# Patient Record
Sex: Female | Born: 1950 | Race: Black or African American | Hispanic: No | Smoking: Former smoker
Health system: Southern US, Community
[De-identification: ages and names within clinical notes are randomized; demographics above are authoritative.]

## PROBLEM LIST (undated history)

## (undated) DIAGNOSIS — N184 Chronic kidney disease, stage 4 (severe): Secondary | ICD-10-CM

## (undated) DIAGNOSIS — F209 Schizophrenia, unspecified: Secondary | ICD-10-CM

## (undated) DIAGNOSIS — I10 Essential (primary) hypertension: Secondary | ICD-10-CM

## (undated) DIAGNOSIS — E119 Type 2 diabetes mellitus without complications: Secondary | ICD-10-CM

## (undated) DIAGNOSIS — D649 Anemia, unspecified: Secondary | ICD-10-CM

## (undated) DIAGNOSIS — K219 Gastro-esophageal reflux disease without esophagitis: Secondary | ICD-10-CM

## (undated) DIAGNOSIS — R918 Other nonspecific abnormal finding of lung field: Secondary | ICD-10-CM

## (undated) DIAGNOSIS — I639 Cerebral infarction, unspecified: Secondary | ICD-10-CM

## (undated) DIAGNOSIS — F329 Major depressive disorder, single episode, unspecified: Secondary | ICD-10-CM

## (undated) DIAGNOSIS — E7800 Pure hypercholesterolemia, unspecified: Secondary | ICD-10-CM

## (undated) DIAGNOSIS — D472 Monoclonal gammopathy: Secondary | ICD-10-CM

## (undated) DIAGNOSIS — M109 Gout, unspecified: Secondary | ICD-10-CM

## (undated) DIAGNOSIS — N189 Chronic kidney disease, unspecified: Secondary | ICD-10-CM

## (undated) DIAGNOSIS — I2510 Atherosclerotic heart disease of native coronary artery without angina pectoris: Secondary | ICD-10-CM

## (undated) DIAGNOSIS — H269 Unspecified cataract: Secondary | ICD-10-CM

## (undated) DIAGNOSIS — I5189 Other ill-defined heart diseases: Secondary | ICD-10-CM

## (undated) DIAGNOSIS — F32A Depression, unspecified: Secondary | ICD-10-CM

## (undated) DIAGNOSIS — R079 Chest pain, unspecified: Secondary | ICD-10-CM

## (undated) HISTORY — DX: Other nonspecific abnormal finding of lung field: R91.8

## (undated) HISTORY — DX: Atherosclerotic heart disease of native coronary artery without angina pectoris: I25.10

## (undated) HISTORY — PX: TUBAL LIGATION: SHX77

## (undated) HISTORY — DX: Unspecified cataract: H26.9

## (undated) HISTORY — DX: Other ill-defined heart diseases: I51.89

## (undated) HISTORY — DX: Chronic kidney disease, unspecified: N18.9

## (undated) HISTORY — DX: Chronic kidney disease, stage 4 (severe): N18.4

## (undated) HISTORY — DX: Anemia, unspecified: D64.9

## (undated) HISTORY — DX: Monoclonal gammopathy: D47.2

## (undated) HISTORY — DX: Chest pain, unspecified: R07.9

---

## 2006-09-05 ENCOUNTER — Inpatient Hospital Stay: Payer: Self-pay | Admitting: Specialist

## 2006-09-05 ENCOUNTER — Other Ambulatory Visit: Payer: Self-pay

## 2006-09-05 HISTORY — PX: ORIF TIBIA PLATEAU: SHX2132

## 2006-09-09 ENCOUNTER — Other Ambulatory Visit: Payer: Self-pay

## 2008-03-20 ENCOUNTER — Ambulatory Visit: Payer: Self-pay | Admitting: Family Medicine

## 2009-02-06 ENCOUNTER — Inpatient Hospital Stay: Payer: Self-pay | Admitting: Internal Medicine

## 2009-04-17 ENCOUNTER — Ambulatory Visit: Payer: Self-pay | Admitting: Internal Medicine

## 2009-12-04 ENCOUNTER — Ambulatory Visit: Payer: Self-pay | Admitting: Gastroenterology

## 2011-02-12 ENCOUNTER — Ambulatory Visit: Payer: Self-pay | Admitting: Internal Medicine

## 2011-08-07 ENCOUNTER — Ambulatory Visit: Payer: Self-pay | Admitting: Internal Medicine

## 2011-10-12 ENCOUNTER — Ambulatory Visit: Payer: Self-pay | Admitting: Internal Medicine

## 2011-10-16 ENCOUNTER — Ambulatory Visit: Payer: Self-pay | Admitting: Internal Medicine

## 2012-08-11 IMAGING — CR DG HUMERUS 2V *R*
1 series · 2 of 2 positions shown · non-contrast
Comparison: none

REASON FOR EXAM: arm pain
COMMENTS:

PROCEDURE:     DXR - DXR HUMERUS RIGHT  - February 12, 2011 [DATE]
RESULT:     No fracture, dislocation or other acute bony abnormality is
identified. No lytic or blastic lesions are seen. No periosteal reactive
changes are identified.

[Series 1: view not recorded · 0.17mm/px · 2 of 2 slices shown]
[im 1/2]
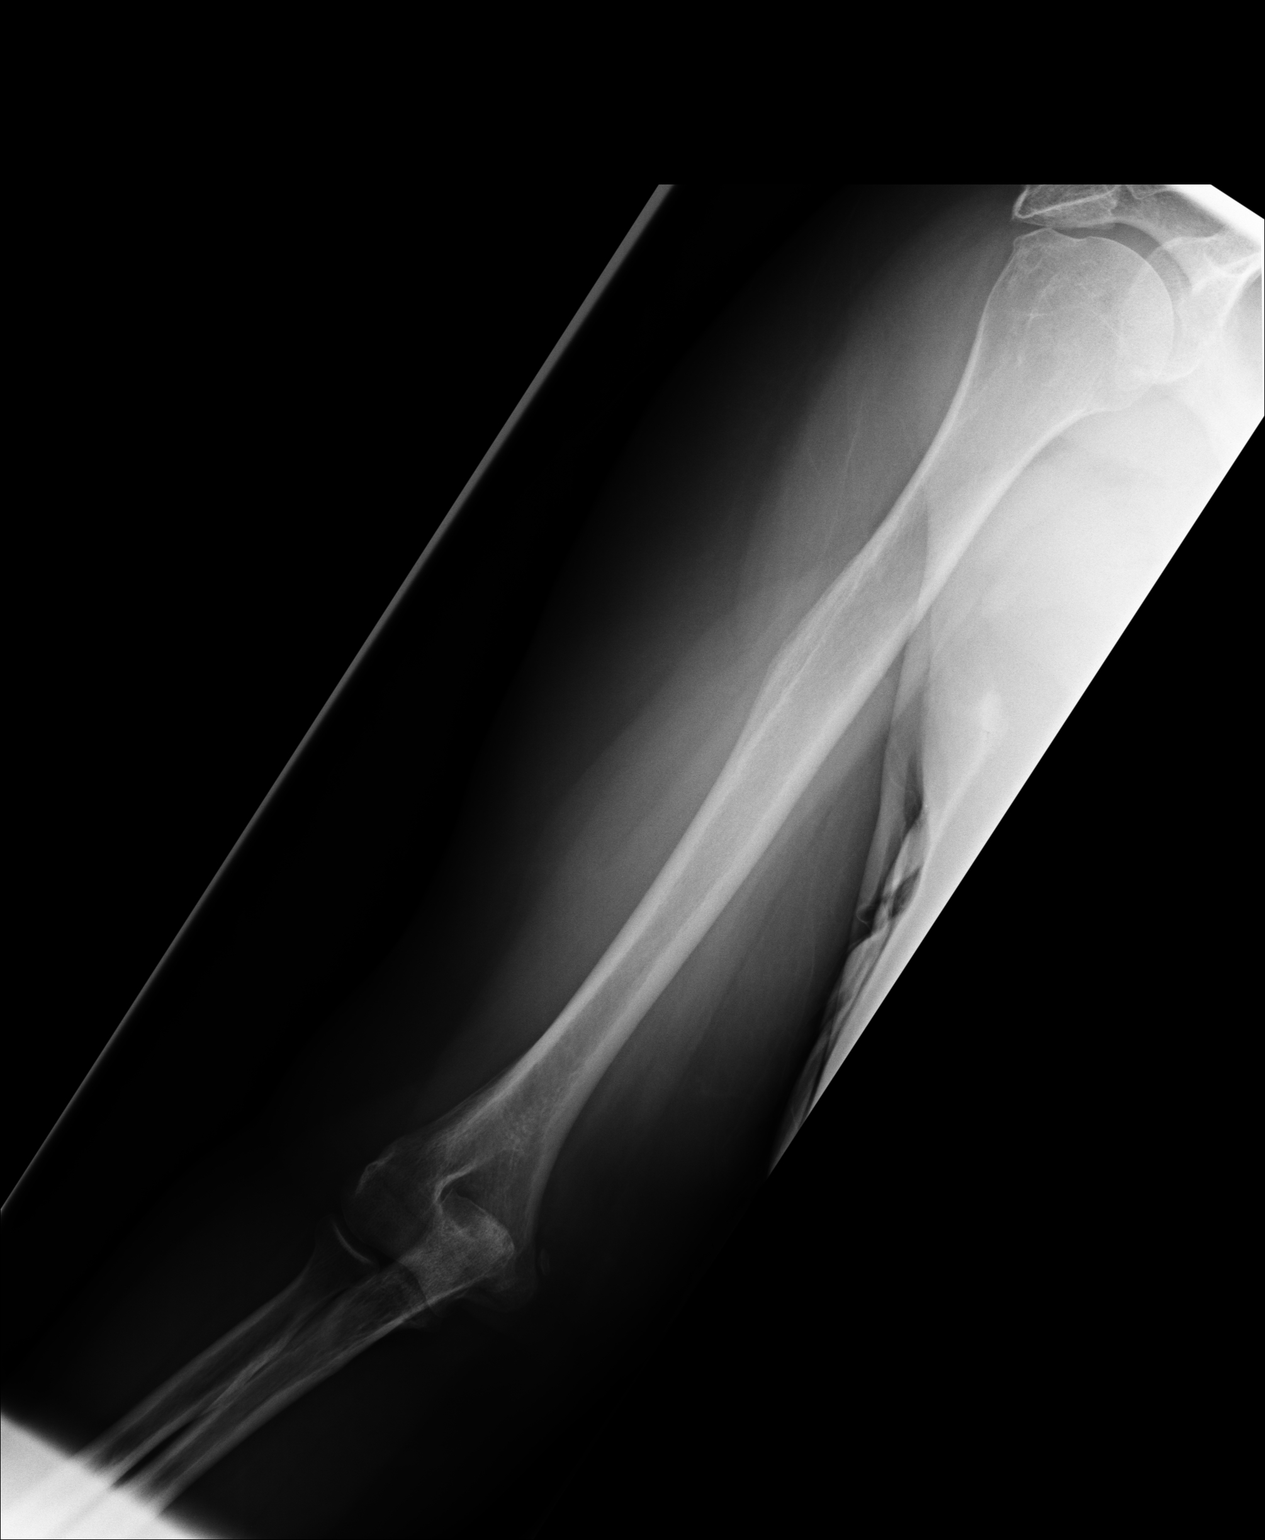
[im 2/2]
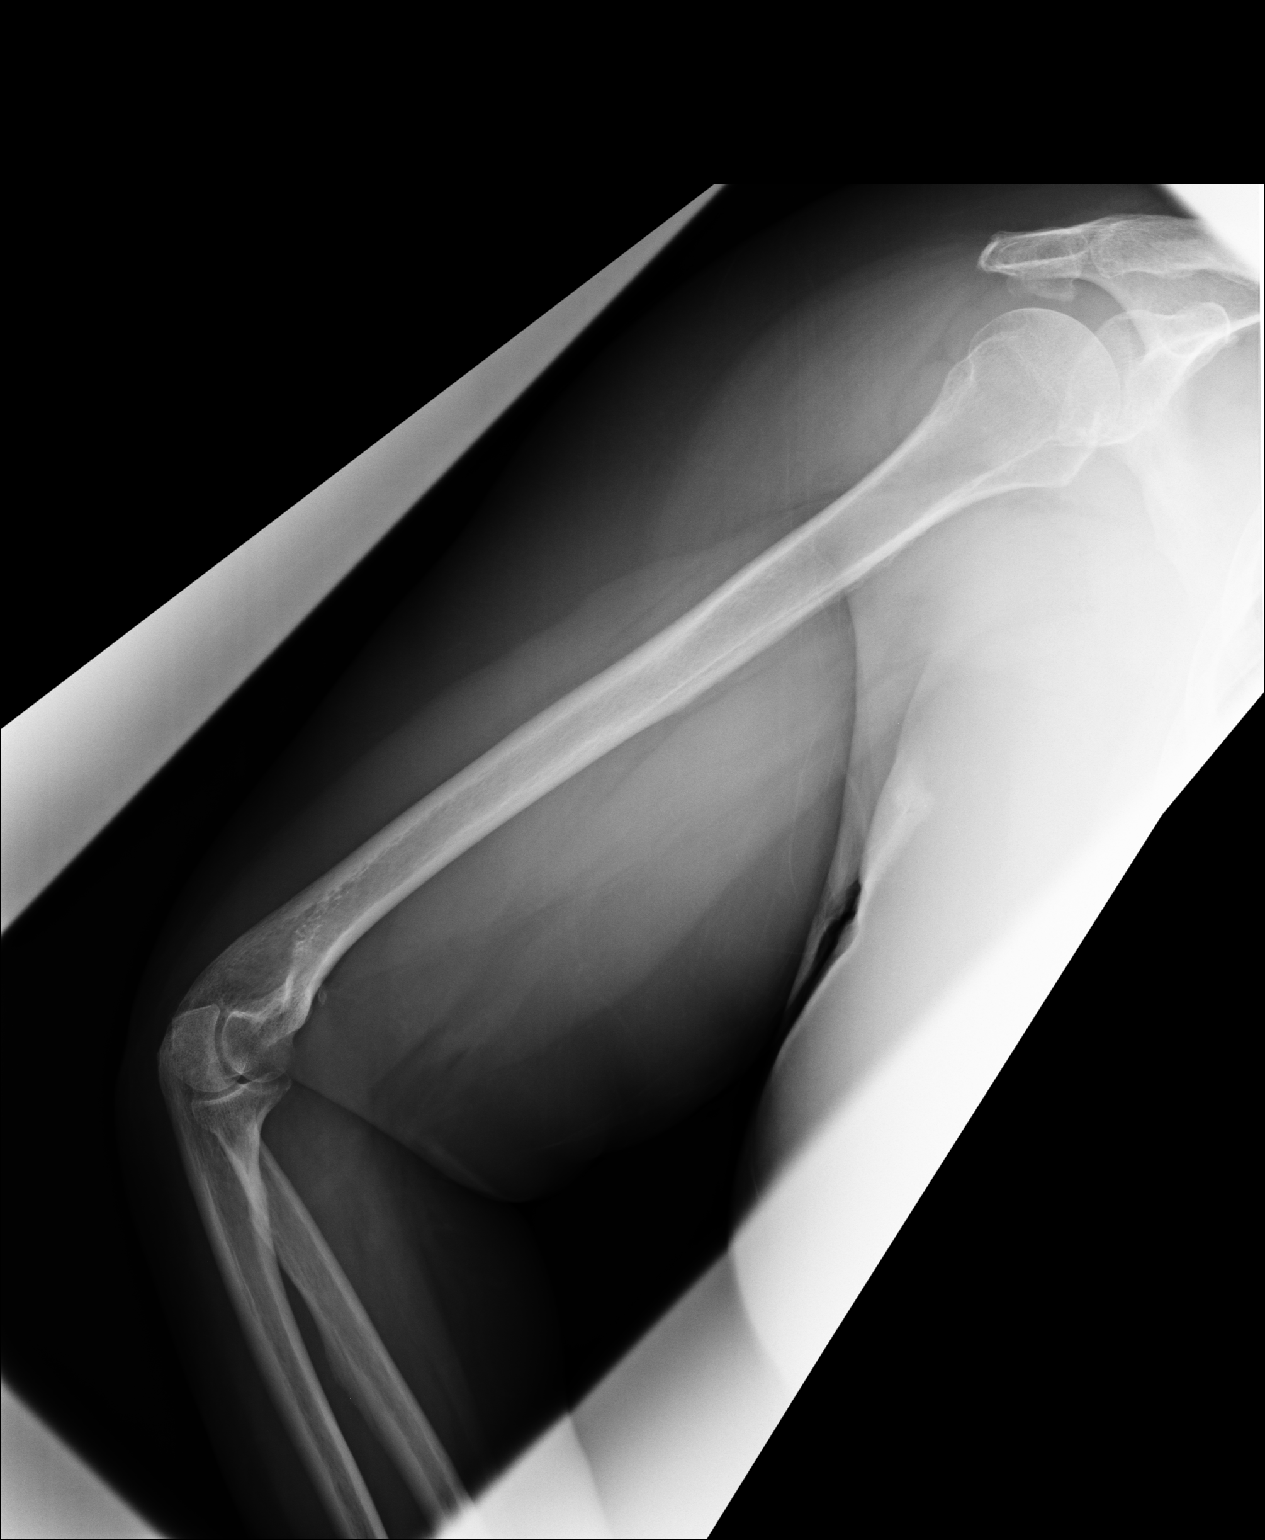

[2 of 2 positions shown; findings below may reference images not displayed]

IMPRESSION: No significant abnormalities are noted.

## 2012-08-18 ENCOUNTER — Emergency Department: Payer: Self-pay | Admitting: Emergency Medicine

## 2012-12-22 ENCOUNTER — Ambulatory Visit: Payer: Self-pay | Admitting: Family Medicine

## 2013-06-19 ENCOUNTER — Emergency Department: Payer: Self-pay | Admitting: Emergency Medicine

## 2013-06-19 LAB — CBC WITH DIFFERENTIAL/PLATELET
Basophil #: 0.1 10*3/uL (ref 0.0–0.1)
Basophil %: 0.6 %
Eosinophil #: 0.1 10*3/uL (ref 0.0–0.7)
Eosinophil %: 1.2 %
HCT: 32.4 % — ABNORMAL LOW (ref 35.0–47.0)
Lymphocyte #: 3.3 10*3/uL (ref 1.0–3.6)
Lymphocyte %: 28.5 %
MCHC: 33.8 g/dL (ref 32.0–36.0)
MCV: 92 fL (ref 80–100)
Monocyte #: 0.7 x10 3/mm (ref 0.2–0.9)
Neutrophil #: 7.4 10*3/uL — ABNORMAL HIGH (ref 1.4–6.5)
Neutrophil %: 63.4 %
Platelet: 250 10*3/uL (ref 150–440)

## 2013-06-19 LAB — COMPREHENSIVE METABOLIC PANEL
Albumin: 3.5 g/dL (ref 3.4–5.0)
Calcium, Total: 9.2 mg/dL (ref 8.5–10.1)
Co2: 28 mmol/L (ref 21–32)
EGFR (African American): 32 — ABNORMAL LOW
EGFR (Non-African Amer.): 27 — ABNORMAL LOW
Osmolality: 290 (ref 275–301)
SGOT(AST): 11 U/L — ABNORMAL LOW (ref 15–37)
SGPT (ALT): 19 U/L (ref 12–78)
Sodium: 139 mmol/L (ref 136–145)
Total Protein: 8.2 g/dL (ref 6.4–8.2)

## 2013-10-22 ENCOUNTER — Emergency Department: Payer: Self-pay | Admitting: Internal Medicine

## 2013-12-19 ENCOUNTER — Ambulatory Visit: Payer: Self-pay | Admitting: Specialist

## 2014-03-26 ENCOUNTER — Ambulatory Visit: Payer: Self-pay | Admitting: Internal Medicine

## 2015-03-28 ENCOUNTER — Ambulatory Visit
Admit: 2015-03-28 | Disposition: A | Discharge status: Home / Self Care | Payer: Self-pay | Attending: Internal Medicine | Admitting: Internal Medicine

## 2016-03-06 NOTE — Discharge Instructions (Signed)

## 2016-03-09 ENCOUNTER — Encounter: Payer: Self-pay | Admitting: *Deleted

## 2016-03-11 ENCOUNTER — Ambulatory Visit
Admission: RE | Admit: 2016-03-11 | Discharge: 2016-03-11 | Disposition: A | Discharge status: Home / Self Care | Payer: Medicare Other | Source: Ambulatory Visit | Attending: Ophthalmology | Admitting: Ophthalmology

## 2016-03-11 ENCOUNTER — Ambulatory Visit: Payer: Medicare Other | Admitting: Anesthesiology

## 2016-03-11 ENCOUNTER — Encounter
Admission: RE | Disposition: A | Discharge status: Home / Self Care | Payer: Self-pay | Source: Ambulatory Visit | Attending: Ophthalmology

## 2016-03-11 DIAGNOSIS — Z720 Tobacco use: Secondary | ICD-10-CM | POA: Diagnosis not present

## 2016-03-11 DIAGNOSIS — Z79899 Other long term (current) drug therapy: Secondary | ICD-10-CM | POA: Diagnosis not present

## 2016-03-11 DIAGNOSIS — Z794 Long term (current) use of insulin: Secondary | ICD-10-CM | POA: Insufficient documentation

## 2016-03-11 DIAGNOSIS — F209 Schizophrenia, unspecified: Secondary | ICD-10-CM | POA: Insufficient documentation

## 2016-03-11 DIAGNOSIS — E119 Type 2 diabetes mellitus without complications: Secondary | ICD-10-CM | POA: Insufficient documentation

## 2016-03-11 DIAGNOSIS — F329 Major depressive disorder, single episode, unspecified: Secondary | ICD-10-CM | POA: Insufficient documentation

## 2016-03-11 DIAGNOSIS — H2512 Age-related nuclear cataract, left eye: Secondary | ICD-10-CM | POA: Insufficient documentation

## 2016-03-11 DIAGNOSIS — I10 Essential (primary) hypertension: Secondary | ICD-10-CM | POA: Diagnosis not present

## 2016-03-11 DIAGNOSIS — M1990 Unspecified osteoarthritis, unspecified site: Secondary | ICD-10-CM | POA: Diagnosis not present

## 2016-03-11 HISTORY — DX: Type 2 diabetes mellitus without complications: E11.9

## 2016-03-11 HISTORY — DX: Major depressive disorder, single episode, unspecified: F32.9

## 2016-03-11 HISTORY — DX: Essential (primary) hypertension: I10

## 2016-03-11 HISTORY — DX: Schizophrenia, unspecified: F20.9

## 2016-03-11 HISTORY — DX: Depression, unspecified: F32.A

## 2016-03-11 HISTORY — PX: CATARACT EXTRACTION W/PHACO: SHX586

## 2016-03-11 HISTORY — DX: Pure hypercholesterolemia, unspecified: E78.00

## 2016-03-11 LAB — GLUCOSE, CAPILLARY
GLUCOSE-CAPILLARY: 215 mg/dL — AB (ref 65–99)
GLUCOSE-CAPILLARY: 206 mg/dL — AB (ref 65–99)

## 2016-03-11 SURGERY — PHACOEMULSIFICATION, CATARACT, WITH IOL INSERTION
Anesthesia: Monitor Anesthesia Care | Site: Eye | Laterality: Left | Wound class: Clean

## 2016-03-11 MED ORDER — TETRACAINE HCL 0.5 % OP SOLN
1.0000 [drp] | OPHTHALMIC | Status: DC | PRN
  Administered 2016-03-11: 1 [drp] via OPHTHALMIC

## 2016-03-11 MED ORDER — MIDAZOLAM HCL 2 MG/2ML IJ SOLN
INTRAMUSCULAR | Status: DC | PRN
  Administered 2016-03-11: 2 mg via INTRAVENOUS

## 2016-03-11 MED ORDER — ARMC OPHTHALMIC DILATING GEL
1.0000 "application " | OPHTHALMIC | Status: DC | PRN
  Administered 2016-03-11 (×2): 1 via OPHTHALMIC

## 2016-03-11 MED ORDER — BRIMONIDINE TARTRATE 0.2 % OP SOLN
OPHTHALMIC | Status: DC | PRN
  Administered 2016-03-11: 1 [drp] via OPHTHALMIC

## 2016-03-11 MED ORDER — ACETAMINOPHEN 160 MG/5ML PO SOLN: 325.0000 mg | ORAL | Status: DC | PRN

## 2016-03-11 MED ORDER — ACETAMINOPHEN 325 MG PO TABS: 325.0000 mg | ORAL_TABLET | ORAL | Status: DC | PRN

## 2016-03-11 MED ORDER — NA HYALUR & NA CHOND-NA HYALUR 0.4-0.35 ML IO KIT
PACK | INTRAOCULAR | Status: DC | PRN
  Administered 2016-03-11: 1 mL via INTRAOCULAR

## 2016-03-11 MED ORDER — POVIDONE-IODINE 5 % OP SOLN
1.0000 "application " | OPHTHALMIC | Status: DC | PRN
  Administered 2016-03-11: 1 via OPHTHALMIC

## 2016-03-11 MED ORDER — FENTANYL CITRATE (PF) 100 MCG/2ML IJ SOLN
INTRAMUSCULAR | Status: DC | PRN
  Administered 2016-03-11: 50 ug via INTRAVENOUS

## 2016-03-11 MED ORDER — BSS IO SOLN
INTRAOCULAR | Status: DC | PRN
  Administered 2016-03-11: 73 mL via OPHTHALMIC

## 2016-03-11 MED ORDER — CEFUROXIME OPHTHALMIC INJECTION 1 MG/0.1 ML
INJECTION | OPHTHALMIC | Status: DC | PRN
  Administered 2016-03-11: 0.1 mL via INTRACAMERAL

## 2016-03-11 MED ORDER — TIMOLOL MALEATE 0.5 % OP SOLN
OPHTHALMIC | Status: DC | PRN
  Administered 2016-03-11: 1 [drp] via OPHTHALMIC

## 2016-03-11 SURGICAL SUPPLY — 26 items
CANNULA ANT/CHMB 27GA (MISCELLANEOUS) ×3 IMPLANT
CARTRIDGE ABBOTT (MISCELLANEOUS) ×3 IMPLANT
GLOVE SURG LX 7.5 STRW (GLOVE) ×2
GLOVE SURG LX STRL 7.5 STRW (GLOVE) ×1 IMPLANT
GLOVE SURG TRIUMPH 8.0 PF LTX (GLOVE) ×3 IMPLANT
GOWN STRL REUS W/ TWL LRG LVL3 (GOWN DISPOSABLE) ×2 IMPLANT
GOWN STRL REUS W/TWL LRG LVL3 (GOWN DISPOSABLE) ×4
LENS IOL TECNIS ITEC 18.0 (Intraocular Lens) ×3 IMPLANT
MARKER SKIN DUAL TIP RULER LAB (MISCELLANEOUS) ×3 IMPLANT
NDL RETROBULBAR .5 NSTRL (NEEDLE) IMPLANT
NEEDLE FILTER BLUNT 18X 1/2SAF (NEEDLE) ×2
NEEDLE FILTER BLUNT 18X1 1/2 (NEEDLE) ×1 IMPLANT
PACK CATARACT BRASINGTON (MISCELLANEOUS) ×3 IMPLANT
PACK EYE AFTER SURG (MISCELLANEOUS) ×3 IMPLANT
PACK OPTHALMIC (MISCELLANEOUS) ×3 IMPLANT
RING MALYGIN 7.0 (MISCELLANEOUS) IMPLANT
SUT ETHILON 10-0 CS-B-6CS-B-6 (SUTURE)
SUT VICRYL  9 0 (SUTURE)
SUT VICRYL 9 0 (SUTURE) IMPLANT
SUTURE EHLN 10-0 CS-B-6CS-B-6 (SUTURE) IMPLANT
SYR 3ML LL SCALE MARK (SYRINGE) ×3 IMPLANT
SYR 5ML LL (SYRINGE) IMPLANT
SYR TB 1ML LUER SLIP (SYRINGE) ×3 IMPLANT
WATER STERILE IRR 250ML POUR (IV SOLUTION) ×3 IMPLANT
WATER STERILE IRR 500ML POUR (IV SOLUTION) IMPLANT
WIPE NON LINTING 3.25X3.25 (MISCELLANEOUS) ×3 IMPLANT

## 2016-03-11 NOTE — Op Note (Signed)
OPERATIVE NOTE  Jennifer Wood [id number] 03/11/2016   PREOPERATIVE DIAGNOSIS:  Nuclear sclerotic cataract left eye. H25.12   POSTOPERATIVE DIAGNOSIS:    Nuclear sclerotic cataract left eye.     PROCEDURE:  Phacoemusification with posterior chamber intraocular lens placement of the left eye   LENS:   Implant Name Type Inv. Item Serial No. Manufacturer Lot No. LRB No. Used  LENS IOL TECNIS 5.0 - [id number] Intraocular Lens LENS IOL TECNIS 5.0 [id number] AMO   Left 1     PCBOO 18.0 D PCIOL   ULTRASOUND TIME: 11  % of 1 minutes 6 seconds, CDE 7.4  SURGEON:  Wyonia Hough, MD   ANESTHESIA:  Topical with tetracaine drops and 2% Xylocaine jelly.   COMPLICATIONS:  None.   DESCRIPTION OF PROCEDURE:  The patient was identified in the holding room and transported to the operating room and placed in the supine position under the operating microscope.  The left eye was identified as the operative eye and it was prepped and draped in the usual sterile ophthalmic fashion.   A 1 millimeter clear-corneal paracentesis was made at the 1:30 position.  The anterior chamber was filled with Viscoat viscoelastic.  A 2.4 millimeter keratome was used to make a near-clear corneal incision at the 10:30 position.  .  A curvilinear capsulorrhexis was made with a cystotome and capsulorrhexis forceps.  Balanced salt solution was used to hydrodissect and hydrodelineate the nucleus.   Phacoemulsification was then used in stop and chop fashion to remove the lens nucleus and epinucleus.  The remaining cortex was then removed using the irrigation and aspiration handpiece. Provisc was then placed into the capsular bag to distend it for lens placement.  A lens was then injected into the capsular bag.  The remaining viscoelastic was aspirated.   Wounds were hydrated with balanced salt solution.  The anterior chamber was inflated to a physiologic pressure with balanced salt solution.  No wound leaks were noted.  Cefuroxime 0.1 ml of a 10mg /ml solution was injected into the anterior chamber for a dose of 1 mg of intracameral antibiotic at the completion of the case.   Timolol and Brimonidine drops were applied to the eye.  The patient was taken to the recovery room in stable condition without complications of anesthesia or surgery.  [name] 03/11/2016, 8:03 AM

## 2016-03-11 NOTE — Anesthesia Postprocedure Evaluation (Signed)
Anesthesia Post Note  Patient: Jennifer Wood  Procedure(s) Performed: Procedure(s) (LRB): CATARACT EXTRACTION PHACO AND INTRAOCULAR LENS PLACEMENT (IOC) (Left)  Patient location during evaluation: PACU Anesthesia Type: MAC Level of consciousness: awake and alert and oriented Pain management: satisfactory to patient Vital Signs Assessment: post-procedure vital signs reviewed and stable Respiratory status: spontaneous breathing, nonlabored ventilation and respiratory function stable Cardiovascular status: blood pressure returned to baseline and stable Postop Assessment: Adequate PO intake and No signs of nausea or vomiting Anesthetic complications: no    Raliegh Ip

## 2016-03-11 NOTE — Transfer of Care (Signed)
Immediate Anesthesia Transfer of Care Note  Patient: Jennifer Wood  Procedure(s) Performed: Procedure(s) with comments: CATARACT EXTRACTION PHACO AND INTRAOCULAR LENS PLACEMENT (IOC) (Left) - DIABETIC - insulin  Patient Location: PACU  Anesthesia Type: MAC  Level of Consciousness: awake, alert  and patient cooperative  Airway and Oxygen Therapy: Patient Spontanous Breathing and Patient connected to supplemental oxygen  Post-op Assessment: Post-op Vital signs reviewed, Patient's Cardiovascular Status Stable, Respiratory Function Stable, Patent Airway and No signs of Nausea or vomiting  Post-op Vital Signs: Reviewed and stable  Complications: No apparent anesthesia complications

## 2016-03-11 NOTE — Anesthesia Preprocedure Evaluation (Signed)
Anesthesia Evaluation  Patient identified by MRN, date of birth, ID band  Reviewed: Allergy & Precautions, H&P , NPO status , Patient's Chart, lab work & pertinent test results  Airway Mallampati: II  TM Distance: >3 FB Neck ROM: full    Dental   Pulmonary    Pulmonary exam normal        Cardiovascular hypertension,  Rhythm:regular Rate:Normal     Neuro/Psych    GI/Hepatic   Endo/Other  diabetes  Renal/GU      Musculoskeletal   Abdominal   Peds  Hematology   Anesthesia Other Findings   Reproductive/Obstetrics                             Anesthesia Physical Anesthesia Plan  ASA: II  Anesthesia Plan: MAC   Post-op Pain Management:    Induction:   Airway Management Planned:   Additional Equipment:   Intra-op Plan:   Post-operative Plan:   Informed Consent: I have reviewed the patients History and Physical, chart, labs and discussed the procedure including the risks, benefits and alternatives for the proposed anesthesia with the patient or authorized representative who has indicated his/her understanding and acceptance.     Plan Discussed with: CRNA  Anesthesia Plan Comments:         Anesthesia Quick Evaluation

## 2016-03-11 NOTE — Anesthesia Procedure Notes (Signed)
Procedure Name: MAC Performed by: Ediberto Sens Pre-anesthesia Checklist: Patient identified, Emergency Drugs available, Suction available, Timeout performed and Patient being monitored Patient Re-evaluated:Patient Re-evaluated prior to inductionOxygen Delivery Method: Nasal cannula Placement Confirmation: positive ETCO2     

## 2016-03-12 ENCOUNTER — Encounter: Payer: Self-pay | Admitting: Ophthalmology

## 2017-03-09 ENCOUNTER — Inpatient Hospital Stay: Payer: Medicare HMO | Attending: Oncology | Admitting: Oncology

## 2017-03-09 ENCOUNTER — Encounter: Payer: Self-pay | Admitting: Oncology

## 2017-03-09 ENCOUNTER — Encounter (INDEPENDENT_AMBULATORY_CARE_PROVIDER_SITE_OTHER): Payer: Self-pay

## 2017-03-09 ENCOUNTER — Inpatient Hospital Stay: Payer: Medicare HMO

## 2017-03-09 VITALS — BP 172/72 | HR 79 | Temp 95.3°F | Resp 18 | Ht 62.99 in | Wt 221.5 lb

## 2017-03-09 DIAGNOSIS — D472 Monoclonal gammopathy: Secondary | ICD-10-CM | POA: Diagnosis not present

## 2017-03-09 DIAGNOSIS — Z794 Long term (current) use of insulin: Secondary | ICD-10-CM | POA: Diagnosis not present

## 2017-03-09 DIAGNOSIS — E1122 Type 2 diabetes mellitus with diabetic chronic kidney disease: Secondary | ICD-10-CM | POA: Diagnosis not present

## 2017-03-09 DIAGNOSIS — E119 Type 2 diabetes mellitus without complications: Secondary | ICD-10-CM | POA: Insufficient documentation

## 2017-03-09 DIAGNOSIS — E7800 Pure hypercholesterolemia, unspecified: Secondary | ICD-10-CM

## 2017-03-09 DIAGNOSIS — R5383 Other fatigue: Secondary | ICD-10-CM

## 2017-03-09 DIAGNOSIS — Z79899 Other long term (current) drug therapy: Secondary | ICD-10-CM

## 2017-03-09 DIAGNOSIS — F329 Major depressive disorder, single episode, unspecified: Secondary | ICD-10-CM | POA: Diagnosis not present

## 2017-03-09 DIAGNOSIS — N189 Chronic kidney disease, unspecified: Secondary | ICD-10-CM

## 2017-03-09 DIAGNOSIS — R531 Weakness: Secondary | ICD-10-CM

## 2017-03-09 DIAGNOSIS — F32A Depression, unspecified: Secondary | ICD-10-CM | POA: Insufficient documentation

## 2017-03-09 DIAGNOSIS — I129 Hypertensive chronic kidney disease with stage 1 through stage 4 chronic kidney disease, or unspecified chronic kidney disease: Secondary | ICD-10-CM | POA: Diagnosis not present

## 2017-03-09 DIAGNOSIS — F209 Schizophrenia, unspecified: Secondary | ICD-10-CM

## 2017-03-09 DIAGNOSIS — I10 Essential (primary) hypertension: Secondary | ICD-10-CM | POA: Insufficient documentation

## 2017-03-09 LAB — LACTATE DEHYDROGENASE: LDH: 135 U/L (ref 98–192)

## 2017-03-09 LAB — CBC WITH DIFFERENTIAL/PLATELET
Basophils Absolute: 0.1 10*3/uL (ref 0–0.1)
Basophils Relative: 1 %
Eosinophils Absolute: 0.1 10*3/uL (ref 0–0.7)
Eosinophils Relative: 1 %
HCT: 29.9 % — ABNORMAL LOW (ref 35.0–47.0)
Hemoglobin: 10.3 g/dL — ABNORMAL LOW (ref 12.0–16.0)
LYMPHS ABS: 2.3 10*3/uL (ref 1.0–3.6)
LYMPHS PCT: 23 %
MCH: 30.4 pg (ref 26.0–34.0)
MCHC: 34.6 g/dL (ref 32.0–36.0)
MCV: 87.9 fL (ref 80.0–100.0)
Monocytes Absolute: 0.8 10*3/uL (ref 0.2–0.9)
Monocytes Relative: 8 %
Neutro Abs: 6.4 10*3/uL (ref 1.4–6.5)
Neutrophils Relative %: 67 %
PLATELETS: 301 10*3/uL (ref 150–440)
RBC: 3.4 MIL/uL — AB (ref 3.80–5.20)
RDW: 12.6 % (ref 11.5–14.5)
WBC: 9.6 10*3/uL (ref 3.6–11.0)

## 2017-03-09 LAB — COMPREHENSIVE METABOLIC PANEL
ALT: 11 U/L — ABNORMAL LOW (ref 14–54)
ANION GAP: 5 (ref 5–15)
AST: 21 U/L (ref 15–41)
Albumin: 3.4 g/dL — ABNORMAL LOW (ref 3.5–5.0)
Alkaline Phosphatase: 69 U/L (ref 38–126)
BILIRUBIN TOTAL: 0.4 mg/dL (ref 0.3–1.2)
BUN: 39 mg/dL — ABNORMAL HIGH (ref 6–20)
CHLORIDE: 103 mmol/L (ref 101–111)
CO2: 26 mmol/L (ref 22–32)
Calcium: 8.8 mg/dL — ABNORMAL LOW (ref 8.9–10.3)
Creatinine, Ser: 1.56 mg/dL — ABNORMAL HIGH (ref 0.44–1.00)
GFR calc Af Amer: 40 mL/min — ABNORMAL LOW (ref >60)
GFR, EST NON AFRICAN AMERICAN: 34 mL/min — AB (ref >60)
Glucose, Bld: 220 mg/dL — ABNORMAL HIGH (ref 65–99)
POTASSIUM: 4.5 mmol/L (ref 3.5–5.1)
Sodium: 134 mmol/L — ABNORMAL LOW (ref 135–145)
Total Protein: 7.7 g/dL (ref 6.5–8.1)

## 2017-03-09 LAB — RETICULOCYTES
RBC.: 3.4 MIL/uL — AB (ref 3.80–5.20)
RBC.: 3.45 MIL/uL — AB (ref 3.80–5.20)
Retic Count, Absolute: 44.2 10*3/uL (ref 19.0–183.0)
Retic Count, Absolute: 48.3 10*3/uL (ref 19.0–183.0)
Retic Ct Pct: 1.3 % (ref 0.4–3.1)
Retic Ct Pct: 1.4 % (ref 0.4–3.1)

## 2017-03-09 LAB — IRON AND TIBC
Iron: 45 ug/dL (ref 28–170)
Saturation Ratios: 15 % (ref 10.4–31.8)
TIBC: 303 ug/dL (ref 250–450)
UIBC: 258 ug/dL

## 2017-03-09 LAB — FERRITIN: Ferritin: 51 ng/mL (ref 11–307)

## 2017-03-09 LAB — SEDIMENTATION RATE: Sed Rate: 70 mm/hr — ABNORMAL HIGH (ref 0–30)

## 2017-03-09 LAB — FOLATE: Folate: 17.5 ng/mL (ref >5.9)

## 2017-03-09 LAB — TSH: TSH: 1.495 u[IU]/mL (ref 0.350–4.500)

## 2017-03-09 LAB — VITAMIN B12: Vitamin B-12: 385 pg/mL (ref 180–914)

## 2017-03-09 NOTE — Progress Notes (Signed)
Referred by Dr Candiss Norse . Pt states she feels well

## 2017-03-09 NOTE — Progress Notes (Signed)
Hematology/Oncology Consult note [facility] Telephone:([phone] Fax:([phone]  Patient Care Team: Ellamae Sia, MD as PCP - General (Internal Medicine)   Name of the patient: Jennifer Wood  [id number]  June 02, 1951    Reason for referral- monclonal protein in serum   Referring physician- Dr. Murlean Iba  Date of visit: 03/09/17   History of presenting illness- Patient is a 66 year old female who sees Dr. Candiss Norse for her CKD. She has long-standing history of hypertension and diabetes as well. Blood work from 11/12/2016 showed hemoglobin of 10.3. BUN and creatinine were elevated at 49/1.54. Patient was noted to have an evidence of 0.9 g as well as 2+ proteinuria. UPEP was negative. CBC from 02/26/2017 showed white count of 9.2, H&H of 9.4/30 and platelet count of 96. Serum calcium was normal at 8.9.CBC from November 2017 showed H&H of 10.3/30.9.  ECOG PS- 1-2  Pain scale- 0   Review of systems- Review of Systems  Constitutional: Positive for malaise/fatigue. Negative for chills, fever and weight loss.  HENT: Negative for congestion, ear discharge and nosebleeds.   Eyes: Negative for blurred vision.  Respiratory: Negative for cough, hemoptysis, sputum production, shortness of breath and wheezing.   Cardiovascular: Negative for chest pain, palpitations, orthopnea and claudication.  Gastrointestinal: Negative for abdominal pain, blood in stool, constipation, diarrhea, heartburn, melena, nausea and vomiting.  Genitourinary: Negative for dysuria, flank pain, frequency, hematuria and urgency.  Musculoskeletal: Positive for back pain. Negative for joint pain and myalgias.  Skin: Negative for rash.  Neurological: Negative for dizziness, tingling, focal weakness, seizures, weakness and headaches.  Endo/Heme/Allergies: Does not bruise/bleed easily.  Psychiatric/Behavioral: Negative for depression and suicidal ideas. The patient does not have insomnia.      No Known Allergies  Patient Active Problem List   Diagnosis Date Noted  . Schizophrenia ([place]) 03/09/2017  . Hypertension 03/09/2017  . Diabetes ([place]) 03/09/2017  . Depression 03/09/2017     Past Medical History:  Diagnosis Date  . Arthritis   . Depression   . Diabetes mellitus without complication ([place])   . Hypercholesteremia   . Hypertension   . Schizophrenia [facility])      Past Surgical History:  Procedure Laterality Date  . CATARACT EXTRACTION W/PHACO Left 03/11/2016   Procedure: CATARACT EXTRACTION PHACO AND INTRAOCULAR LENS PLACEMENT (IOC);  Surgeon: Leandrew Koyanagi, MD;  Location: [place];  Service: Ophthalmology;  Laterality: Left;  DIABETIC - insulin  . ORIF TIBIA PLATEAU Right 09/05/06   [facility]    Social History   Social History  . Marital status: Divorced    Spouse name: N/A  . Number of children: N/A  . Years of education: N/A   Occupational History  . Not on file.   Social History Main Topics  . Smoking status: Never Smoker  . Smokeless tobacco: Not on file  . Alcohol use No  . Drug use: Unknown  . Sexual activity: Not on file   Other Topics Concern  . Not on file   Social History Narrative  . No narrative on file     No family history on file.   Current Outpatient Prescriptions:  .  amLODipine (NORVASC) 5 MG tablet, Take 5 mg by mouth daily., Disp: , Rfl:  .  Cholecalciferol (VITAMIN D PO), Take by mouth., Disp: , Rfl:  .  insulin aspart (NOVOLOG) 100 UNIT/ML injection, Inject into the skin 3 (three) times daily before meals., Disp: , Rfl:  .  insulin glargine (LANTUS) 100 UNIT/ML  injection, Inject 14 Units into the skin at bedtime., Disp: , Rfl:  .  lisinopril (PRINIVIL,ZESTRIL) 20 MG tablet, Take 20 mg by mouth 2 (two) times daily., Disp: , Rfl:  .  lovastatin (MEVACOR) 40 MG tablet, Take 40 mg by mouth 2 (two) times daily., Disp: , Rfl:  .  MELATONIN PO, Take by mouth., Disp: , Rfl:  .  METOPROLOL TARTRATE PO, Take by  mouth daily., Disp: , Rfl:    Physical exam:  Vitals:   03/09/17 1544  BP: (!) 172/72  Pulse: 79  Resp: 18  Temp: (!) 95.3 F (35.2 C)  TempSrc: Tympanic  Weight: 221 lb 7.2 oz (100.4 kg)  Height: 5' 2.99" (1.6 m)   Physical Exam  Constitutional: She is oriented to person, place, and time and well-developed, well-nourished, and in no distress.  HENT:  Head: Normocephalic and atraumatic.  Eyes: EOM are normal. Pupils are equal, round, and reactive to light.  Neck: Normal range of motion.  Cardiovascular: Normal rate, regular rhythm and normal heart sounds.   Pulmonary/Chest: Effort normal and breath sounds normal.  Abdominal: Soft. Bowel sounds are normal.  Neurological: She is alert and oriented to person, place, and time.  Skin: Skin is warm and dry.       CMP Latest Ref Rng & Units 06/19/2013  Glucose 65 - 99 mg/dL 174(H)  BUN 7 - 18 mg/dL 35(H)  Creatinine 0.60 - 1.30 mg/dL 1.94(H)  Sodium 136 - 145 mmol/L 139  Potassium 3.5 - 5.1 mmol/L 4.5  Chloride 98 - 107 mmol/L 103  CO2 21 - 32 mmol/L 28  Calcium 8.5 - 10.1 mg/dL 9.2  Total Protein 6.4 - 8.2 g/dL 8.2  Total Bilirubin 0.2 - 1.0 mg/dL 0.4  Alkaline Phos 50 - 136 Unit/L 84  AST 15 - 37 Unit/L 11(L)  ALT 12 - 78 U/L 19   CBC Latest Ref Rng & Units 06/19/2013  WBC 3.6 - 11.0 x10 3/mm 3 11.7(H)  Hemoglobin 12.0 - 16.0 g/dL 10.9(L)  Hematocrit 35.0 - 47.0 % 32.4(L)  Platelets 150 - 440 x10 3/mm 3 250    No images are attached to the encounter.  No results found.  Assessment and plan- Patient is a 66 y.o. female who has been referred to [place] for monoclonal protein found in serum  Today I will do a complete workup including CBC with differential, CMP, ferritin and iron studies, B12, folate, TSH, haptoglobin, reticulocyte count, ESR and myeloma panel. I will also obtain beta-2 microglobulin, LDH, 24-hour urine protein along with the UPEP and urine immunofixation. Depending on the type and level of monoclonal  protein, I will decide if patient needs further investigation such as skeletal survey and bone marrow biopsy. I will see the patient back in 3 weeks' time to discuss the results of her blood work and further workup   Thank you for this kind referral and the opportunity to participate in the care of this patient   Visit Diagnosis 1. MGUS (monoclonal gammopathy of unknown significance)     Dr. Randa Evens, MD, MPH [facility] at [facility] Pager- [phone] 03/09/2017  5:00 PM

## 2017-03-10 DIAGNOSIS — D472 Monoclonal gammopathy: Secondary | ICD-10-CM | POA: Diagnosis not present

## 2017-03-10 LAB — BETA 2 MICROGLOBULIN, SERUM: BETA 2 MICROGLOBULIN: 4.2 mg/L — AB (ref 0.6–2.4)

## 2017-03-10 LAB — HAPTOGLOBIN: HAPTOGLOBIN: 187 mg/dL (ref 34–200)

## 2017-03-11 LAB — MULTIPLE MYELOMA PANEL, SERUM
ALBUMIN SERPL ELPH-MCNC: 3 g/dL (ref 2.9–4.4)
ALPHA 1: 0.2 g/dL (ref 0.0–0.4)
Albumin/Glob SerPl: 0.8 (ref 0.7–1.7)
Alpha2 Glob SerPl Elph-Mcnc: 0.8 g/dL (ref 0.4–1.0)
B-Globulin SerPl Elph-Mcnc: 1.1 g/dL (ref 0.7–1.3)
Gamma Glob SerPl Elph-Mcnc: 1.9 g/dL — ABNORMAL HIGH (ref 0.4–1.8)
Globulin, Total: 4 g/dL — ABNORMAL HIGH (ref 2.2–3.9)
IGA: 221 mg/dL (ref 87–352)
IGM, SERUM: 134 mg/dL (ref 26–217)
IgG (Immunoglobin G), Serum: 1852 mg/dL — ABNORMAL HIGH (ref 700–1600)
M Protein SerPl Elph-Mcnc: 0.7 g/dL — ABNORMAL HIGH
TOTAL PROTEIN ELP: 7 g/dL (ref 6.0–8.5)

## 2017-03-12 ENCOUNTER — Other Ambulatory Visit: Payer: Self-pay

## 2017-03-12 DIAGNOSIS — D472 Monoclonal gammopathy: Secondary | ICD-10-CM

## 2017-03-15 LAB — IFE+PROTEIN ELECTRO, 24-HR UR
% BETA, Urine: 8.9 %
ALPHA 1 URINE: 0.5 %
Albumin, U: 73.4 %
Alpha 2, Urine: 3.9 %
GAMMA GLOBULIN URINE: 13.2 %
M-SPIKE %, Urine: 3.9 % — ABNORMAL HIGH
M-SPIKE, MG/24 HR: 39 mg/(24.h) — AB
TOTAL PROTEIN, URINE-UPE24: 100.2 mg/dL
TOTAL VOLUME: 1000
Total Protein, Urine-Ur/day: 1002 mg/24 hr — ABNORMAL HIGH (ref 30–150)

## 2017-04-06 ENCOUNTER — Inpatient Hospital Stay: Payer: Medicare HMO | Attending: Oncology | Admitting: Oncology

## 2017-04-06 VITALS — BP 135/66 | HR 64 | Temp 97.7°F | Resp 18 | Wt 214.0 lb

## 2017-04-06 DIAGNOSIS — G8929 Other chronic pain: Secondary | ICD-10-CM | POA: Insufficient documentation

## 2017-04-06 DIAGNOSIS — M549 Dorsalgia, unspecified: Secondary | ICD-10-CM | POA: Diagnosis not present

## 2017-04-06 DIAGNOSIS — F329 Major depressive disorder, single episode, unspecified: Secondary | ICD-10-CM | POA: Diagnosis not present

## 2017-04-06 DIAGNOSIS — E7800 Pure hypercholesterolemia, unspecified: Secondary | ICD-10-CM | POA: Insufficient documentation

## 2017-04-06 DIAGNOSIS — Z794 Long term (current) use of insulin: Secondary | ICD-10-CM | POA: Diagnosis not present

## 2017-04-06 DIAGNOSIS — E119 Type 2 diabetes mellitus without complications: Secondary | ICD-10-CM | POA: Diagnosis not present

## 2017-04-06 DIAGNOSIS — N189 Chronic kidney disease, unspecified: Secondary | ICD-10-CM | POA: Insufficient documentation

## 2017-04-06 DIAGNOSIS — Z79899 Other long term (current) drug therapy: Secondary | ICD-10-CM | POA: Diagnosis not present

## 2017-04-06 DIAGNOSIS — I129 Hypertensive chronic kidney disease with stage 1 through stage 4 chronic kidney disease, or unspecified chronic kidney disease: Secondary | ICD-10-CM | POA: Diagnosis not present

## 2017-04-06 DIAGNOSIS — M129 Arthropathy, unspecified: Secondary | ICD-10-CM | POA: Insufficient documentation

## 2017-04-06 DIAGNOSIS — F17210 Nicotine dependence, cigarettes, uncomplicated: Secondary | ICD-10-CM | POA: Insufficient documentation

## 2017-04-06 DIAGNOSIS — D472 Monoclonal gammopathy: Secondary | ICD-10-CM | POA: Insufficient documentation

## 2017-04-06 NOTE — Progress Notes (Signed)
Hematology/Oncology Consult note [facility]  Telephone:([phone] [phone]  Patient Care Team: Ellamae Sia, MD as PCP - General (Internal Medicine)   Name of the patient: Jennifer Wood  [id number]  09-08-1951   Date of visit: 04/06/17  Diagnosis- MGUS  Chief complaint/ Reason for visit- discuss results of bloodwork  Heme/Onc history: Patient is a 66 year old female who sees Dr. Candiss Norse for her CKD. She has long-standing history of hypertension and diabetes as well. Blood work from 11/12/2016 showed hemoglobin of 10.3. BUN and creatinine were elevated at 49/1.54. Patient was noted to have an evidence of 0.9 g as well as 2+ proteinuria. UPEP was negative. CBC from 02/26/2017 showed white count of 9.2, H&H of 9.4/30 and platelet count of 96. Serum calcium was normal at 8.9.CBC from November 2017 showed H&H of 10.3/30.9.  Results of bloodwork from 03/09/2017 were as follows: CBC showed white count of 9.6, H&H of 10.3/29.9 and a platelet count of 301. CMP was significant for BUN of 39 and creatinine of 1.56. Reticulocyte count was 1.4% and low for the degree of anemia. Ferritin was normal at 51 and iron studies are within normal limits. B12 and folate were within normal limits. TSH was normal at 1.49. Haptoglobin was normal at 187. ESR was elevated at 70. Multiple myeloma panel showed elevated IgG of 1000 852. Serum monoclonal protein of 0.7 g was noted. Lambda light chain specificity. LDH was normal at 135 and beta-2 microglobulin was elevated at 4.2. 24-hour urine protein revealed 1gm proteinuria and 39 mg monoclonal protein in 24 hours.   Interval history- reports chronic back pain. She has been trying to regulate her diet to control her sugars better  ECOG PS- 1 Pain scale- 5   Review of systems- Review of Systems  Constitutional: Negative for chills, fever, malaise/fatigue and weight loss.  HENT: Negative for congestion, ear discharge and  nosebleeds.   Eyes: Negative for blurred vision.  Respiratory: Negative for cough, hemoptysis, sputum production, shortness of breath and wheezing.   Cardiovascular: Negative for chest pain, palpitations, orthopnea and claudication.  Gastrointestinal: Negative for abdominal pain, blood in stool, constipation, diarrhea, heartburn, melena, nausea and vomiting.  Genitourinary: Negative for dysuria, flank pain, frequency, hematuria and urgency.  Musculoskeletal: Negative for back pain, joint pain and myalgias.  Skin: Negative for rash.  Neurological: Negative for dizziness, tingling, focal weakness, seizures, weakness and headaches.  Endo/Heme/Allergies: Does not bruise/bleed easily.  Psychiatric/Behavioral: Negative for depression and suicidal ideas. The patient does not have insomnia.      Current treatment- observation  No Known Allergies   Past Medical History:  Diagnosis Date  . Arthritis   . Depression   . Diabetes mellitus without complication ([place])   . Hypercholesteremia   . Hypertension   . Schizophrenia ([place])    pt denies this dx     Past Surgical History:  Procedure Laterality Date  . CATARACT EXTRACTION W/PHACO Left 03/11/2016   Procedure: CATARACT EXTRACTION PHACO AND INTRAOCULAR LENS PLACEMENT (IOC);  Surgeon: Leandrew Koyanagi, MD;  Location: [place];  Service: Ophthalmology;  Laterality: Left;  DIABETIC - insulin  . ORIF TIBIA PLATEAU Right 09/05/06   [facility]    Social History   Social History  . Marital status: Divorced    Spouse name: N/A  . Number of children: N/A  . Years of education: N/A   Occupational History  . Not on file.   Social History Main Topics  . Smoking status:  Light Tobacco Smoker    Packs/day: 0.25    Years: 5.00  . Smokeless tobacco: Current User  . Alcohol use No  . Drug use: No  . Sexual activity: No   Other Topics Concern  . Not on file   Social History Narrative  . No narrative on file    No family history  on file.   Current Outpatient Prescriptions:  .  amLODipine (NORVASC) 5 MG tablet, Take 5 mg by mouth daily., Disp: , Rfl:  .  B-D UF III MINI PEN NEEDLES 31G X 5 MM MISC, , Disp: , Rfl:  .  Cholecalciferol (VITAMIN D PO), Take by mouth., Disp: , Rfl:  .  insulin aspart (NOVOLOG) 100 UNIT/ML injection, Inject into the skin 3 (three) times daily before meals., Disp: , Rfl:  .  insulin glargine (LANTUS) 100 UNIT/ML injection, Inject 14 Units into the skin at bedtime., Disp: , Rfl:  .  LANTUS SOLOSTAR 100 UNIT/ML Solostar Pen, , Disp: , Rfl:  .  lisinopril (PRINIVIL,ZESTRIL) 20 MG tablet, Take 20 mg by mouth 2 (two) times daily., Disp: , Rfl:  .  lovastatin (MEVACOR) 40 MG tablet, Take 40 mg by mouth 2 (two) times daily., Disp: , Rfl:  .  METOPROLOL TARTRATE PO, Take 100 mg by mouth daily. , Disp: , Rfl:  .  NOVOLOG FLEXPEN 100 UNIT/ML FlexPen, , Disp: , Rfl:  .  TRUE METRIX BLOOD GLUCOSE TEST test strip, , Disp: , Rfl:  .  TRUEPLUS LANCETS 33G MISC, , Disp: , Rfl:   Physical exam:  Vitals:   04/06/17 1106  BP: 135/66  Pulse: 64  Resp: 18  Temp: 97.7 F (36.5 C)  TempSrc: Tympanic  Weight: 214 lb (97.1 kg)   Physical Exam  Constitutional: She is oriented to person, place, and time and well-developed, well-nourished, and in no distress.  HENT:  Head: Normocephalic and atraumatic.  Eyes: EOM are normal. Pupils are equal, round, and reactive to light.  Neck: Normal range of motion.  Cardiovascular: Normal rate, regular rhythm and normal heart sounds.   Pulmonary/Chest: Effort normal and breath sounds normal.  Abdominal: Soft. Bowel sounds are normal.  Neurological: She is alert and oriented to person, place, and time.  Skin: Skin is warm and dry.     CMP Latest Ref Rng & Units 03/09/2017  Glucose 65 - 99 mg/dL 220(H)  BUN 6 - 20 mg/dL 39(H)  Creatinine 0.44 - 1.00 mg/dL 1.56(H)  Sodium 135 - 145 mmol/L 134(L)  Potassium 3.5 - 5.1 mmol/L 4.5  Chloride 101 - 111 mmol/L 103  CO2  22 - 32 mmol/L 26  Calcium 8.9 - 10.3 mg/dL 8.8(L)  Total Protein 6.5 - 8.1 g/dL 7.7  Total Bilirubin 0.3 - 1.2 mg/dL 0.4  Alkaline Phos 38 - 126 U/L 69  AST 15 - 41 U/L 21  ALT 14 - 54 U/L 11(L)   CBC Latest Ref Rng & Units 03/09/2017  WBC 3.6 - 11.0 K/uL 9.6  Hemoglobin 12.0 - 16.0 g/dL 10.3(L)  Hematocrit 35.0 - 47.0 % 29.9(L)  Platelets 150 - 440 K/uL 301      Assessment and plan- Patient is a 66 y.o. female who was referred to [place] for abnormal SPEP  I discussed the results of the blood work with the patient which shows that she has a small amount of monoclonal 0.7 g in her serum which is likely consistent with MGUS. She does have significant proteinuria of 1 g along with IgG  monoclonal protein in her urine as well. I suspect that a significant degree of her proteinuria is because of her diabetes which remains uncontrolled and her chronic kidney disease is also likely secondary to diabetes. Given that she has significant proteinuria I would like to complete her multiple myeloma workup and proceed with a skeletal survey and bone marrow biopsy. If these findings are consistent with MGUS, I am inclined to monitor this conservatively with periodic blood work.   I will see the patient back in 4 weeks time after skeletal survey and bone marrow biopsy   Visit Diagnosis 1. MGUS (monoclonal gammopathy of unknown significance)      Dr. Randa Evens, MD, MPH [facility] at [facility] Pager- [phone] 04/06/2017 1:33 PM

## 2017-04-07 ENCOUNTER — Ambulatory Visit
Admission: RE | Admit: 2017-04-07 | Discharge: 2017-04-07 | Disposition: A | Discharge status: Home / Self Care | Payer: Medicare HMO | Source: Ambulatory Visit | Attending: Oncology | Admitting: Oncology

## 2017-04-07 ENCOUNTER — Other Ambulatory Visit: Payer: Self-pay | Admitting: Oncology

## 2017-04-07 DIAGNOSIS — D472 Monoclonal gammopathy: Secondary | ICD-10-CM | POA: Diagnosis present

## 2017-04-13 ENCOUNTER — Other Ambulatory Visit: Payer: Self-pay | Admitting: Oncology

## 2017-04-13 ENCOUNTER — Telehealth: Payer: Self-pay | Admitting: *Deleted

## 2017-04-13 DIAGNOSIS — D472 Monoclonal gammopathy: Secondary | ICD-10-CM

## 2017-04-13 NOTE — Telephone Encounter (Signed)
Notified patient of her Bone Marrow biopsy appt. On 4/24. Patient was given the following instructions: Nothing by mouth after midnight the day before the procedure. Take medications with small sip of water. [facility] medical mall at 8:00 AM Must have driver for trip home. Her next appointment is 04/27/2017 at 11:45 AM at the cancer center with Dr. Janese Banks. Patient was able to repeat back instructions and verbalized understanding.

## 2017-04-19 ENCOUNTER — Other Ambulatory Visit: Payer: Self-pay | Admitting: Radiology

## 2017-04-20 ENCOUNTER — Ambulatory Visit
Admission: RE | Admit: 2017-04-20 | Discharge: 2017-04-20 | Disposition: A | Discharge status: Home / Self Care | Payer: Medicare HMO | Source: Ambulatory Visit | Attending: Oncology | Admitting: Oncology

## 2017-04-20 ENCOUNTER — Other Ambulatory Visit (HOSPITAL_COMMUNITY)
Admission: RE | Admit: 2017-04-20 | Disposition: A | Discharge status: Home / Self Care | Payer: Medicare HMO | Source: Ambulatory Visit | Attending: Oncology | Admitting: Oncology

## 2017-04-20 ENCOUNTER — Ambulatory Visit: Payer: Medicare HMO

## 2017-04-20 DIAGNOSIS — D72822 Plasmacytosis: Secondary | ICD-10-CM | POA: Insufficient documentation

## 2017-04-20 DIAGNOSIS — D649 Anemia, unspecified: Secondary | ICD-10-CM | POA: Diagnosis not present

## 2017-04-20 DIAGNOSIS — D472 Monoclonal gammopathy: Secondary | ICD-10-CM | POA: Diagnosis not present

## 2017-04-20 LAB — PROTIME-INR
INR: 1.06
Prothrombin Time: 13.8 seconds (ref 11.4–15.2)

## 2017-04-20 LAB — CBC WITH DIFFERENTIAL/PLATELET
BASOS ABS: 0.1 10*3/uL (ref 0–0.1)
Basophils Relative: 1 %
EOS ABS: 0.1 10*3/uL (ref 0–0.7)
Eosinophils Relative: 1 %
HCT: 30.4 % — ABNORMAL LOW (ref 35.0–47.0)
Hemoglobin: 10.3 g/dL — ABNORMAL LOW (ref 12.0–16.0)
Lymphocytes Relative: 23 %
Lymphs Abs: 1.8 10*3/uL (ref 1.0–3.6)
MCH: 30.1 pg (ref 26.0–34.0)
MCHC: 33.8 g/dL (ref 32.0–36.0)
MCV: 89.2 fL (ref 80.0–100.0)
MONOS PCT: 8 %
Monocytes Absolute: 0.6 10*3/uL (ref 0.2–0.9)
Neutro Abs: 5.3 10*3/uL (ref 1.4–6.5)
Neutrophils Relative %: 67 %
Platelets: 311 10*3/uL (ref 150–440)
RBC: 3.41 MIL/uL — ABNORMAL LOW (ref 3.80–5.20)
RDW: 13.4 % (ref 11.5–14.5)
WBC: 7.8 10*3/uL (ref 3.6–11.0)

## 2017-04-20 LAB — GLUCOSE, CAPILLARY: GLUCOSE-CAPILLARY: 168 mg/dL — AB (ref 65–99)

## 2017-04-20 LAB — APTT: aPTT: 44 seconds — ABNORMAL HIGH (ref 24–36)

## 2017-04-20 MED ORDER — SODIUM CHLORIDE 0.9 % IV SOLN
INTRAVENOUS | Status: DC
  Administered 2017-04-20: 09:00:00 via INTRAVENOUS

## 2017-04-20 MED ORDER — MIDAZOLAM HCL 2 MG/2ML IJ SOLN
INTRAMUSCULAR | Status: AC | PRN
  Administered 2017-04-20: 1 mg via INTRAVENOUS

## 2017-04-20 MED ORDER — FENTANYL CITRATE (PF) 100 MCG/2ML IJ SOLN
INTRAMUSCULAR | Status: AC | PRN
  Administered 2017-04-20: 25 ug via INTRAVENOUS
  Administered 2017-04-20: 50 ug via INTRAVENOUS
  Administered 2017-04-20: 25 ug via INTRAVENOUS

## 2017-04-20 MED ORDER — HYDROCODONE-ACETAMINOPHEN 5-325 MG PO TABS: 1.0000 | ORAL_TABLET | ORAL | Status: DC | PRN

## 2017-04-20 MED ORDER — MIDAZOLAM HCL 5 MG/5ML IJ SOLN
INTRAMUSCULAR | Status: AC | PRN
  Administered 2017-04-20 (×2): 1 mg via INTRAVENOUS

## 2017-04-20 NOTE — Discharge Instructions (Signed)
Needle Biopsy of the Bone °A bone biopsy is a procedure in which a small sample of bone is removed. The sample is taken with a needle. Then, the bone sample is looked at under a microscope to check for abnormalities. The sample is usually taken from a bone that is close to the skin. This procedure may be done to check for various problems with the bone. You may need this procedure if imaging tests or blood tests have indicated a possible problem. This procedure may be done to help determine if a bone tumor is cancerous (malignant). A bone biopsy can help to diagnose problems such as: °· Tumors of the bone (sarcomas) and bone marrow (multiple myeloma). °· Bone that forms abnormally (Paget disease). °· Noncancerous (benign) bone cysts. °· Bony growths. °· Infections in the bone. °Tell a health care provider about: °· Any allergies you have. °· All medicines you are taking, including vitamins, herbs, eye drops, creams, and over-the-counter medicines. °· Any problems you or family members have had with anesthetic medicines. °· Any blood disorders you have. °· Any surgeries you have had. °· Any medical conditions you have. °What are the risks? °Generally, this is a safe procedure. However, problems may occur, including: °· Excessive bleeding. °· Infection. °· Injury to surrounding tissue. °What happens before the procedure? °· Ask your health care provider about: °¨ Changing or stopping your regular medicines. This is especially important if you are taking diabetes medicines or blood thinners. °¨ Taking medicines such as aspirin and ibuprofen. These medicines can thin your blood. Do not take these medicines before your procedure if your health care provider instructs you not to. °· Follow instructions from your health care provider about eating or drinking restrictions. °· Plan to have someone take you home after the procedure. °· If you go home right after the procedure, plan to have someone with you for 24 hours. °What  happens during the procedure? °· An IV tube may be inserted into one of your veins. °· The injection site will be cleaned with a germ-killing solution (antiseptic). °· You will be given one or more of the following: °¨ A medicine to help you relax (sedative). °¨ A medicine to numb the area (local anesthetic). °· The sample of bone will be removed by putting a large needle through the skin and into the bone. °· The needle will be removed. °· A bandage (dressing) will be placed over the insertion site and taped in place. °The procedure may vary among health care providers and hospitals. °What happens after the procedure? °· Your blood pressure, heart rate, breathing rate, and blood oxygen level will be monitored often until the medicines you were given have worn off. °· Return to your normal activities as told by your health care provider. °This information is not intended to replace advice given to you by your health care provider. Make sure you discuss any questions you have with your health care provider. °Document Released: 10/22/2004 Document Revised: 05/21/2016 Document Reviewed: 01/21/2015 °Elsevier Interactive Patient Education © 2017 [employer] ° °

## 2017-04-20 NOTE — Procedures (Signed)
Procedure and risks discussed with patient. Informed consent obtained.Will perform CT-guided bone marrow biopsy.

## 2017-04-20 NOTE — Procedures (Signed)
Under CT guidance, bone marrow aspiration and core biopsy of left iliac bone was performed. No immediate complication.

## 2017-04-27 ENCOUNTER — Inpatient Hospital Stay: Payer: Medicare HMO | Attending: Oncology | Admitting: Oncology

## 2017-04-27 VITALS — BP 149/75 | HR 61 | Temp 94.9°F | Resp 18 | Wt 208.8 lb

## 2017-04-27 DIAGNOSIS — N189 Chronic kidney disease, unspecified: Secondary | ICD-10-CM | POA: Diagnosis not present

## 2017-04-27 DIAGNOSIS — D472 Monoclonal gammopathy: Secondary | ICD-10-CM

## 2017-04-27 DIAGNOSIS — Z87891 Personal history of nicotine dependence: Secondary | ICD-10-CM | POA: Diagnosis not present

## 2017-04-27 DIAGNOSIS — Z794 Long term (current) use of insulin: Secondary | ICD-10-CM

## 2017-04-27 DIAGNOSIS — Z79899 Other long term (current) drug therapy: Secondary | ICD-10-CM

## 2017-04-27 DIAGNOSIS — I129 Hypertensive chronic kidney disease with stage 1 through stage 4 chronic kidney disease, or unspecified chronic kidney disease: Secondary | ICD-10-CM

## 2017-04-27 DIAGNOSIS — E1122 Type 2 diabetes mellitus with diabetic chronic kidney disease: Secondary | ICD-10-CM | POA: Diagnosis not present

## 2017-04-27 NOTE — Progress Notes (Signed)
Here for follow up.states she is "doing well" pt loudly stated that her insulin syringes " arent working " agreed to walk to [employer] today and talk to them about pre loaded syringes -in agreement w this plan. Will f/u w PCP w insulin concerns.agreed pt

## 2017-04-27 NOTE — Progress Notes (Signed)
Hematology/Oncology Consult note [facility]  Telephone:([phone] [phone]  Patient Care Team: Ellamae Sia, MD as PCP - General (Internal Medicine)   Name of the patient: Jennifer Wood  [id number]  1951-12-12   Date of visit: 04/27/17  Diagnosis- IgG MGUS  Chief complaint/ Reason for visit- discuss results of bone marrow biopsy  Heme/Onc history: Patient is a 66 year old female who sees Dr. Candiss Norse for her CKD. She has long-standing history of hypertension and diabetes as well. Blood work from 11/12/2016 showed hemoglobin of 10.3. BUN and creatinine were elevated at 49/1.54. Patient was noted to have an evidence of 0.9 g as well as 2+ proteinuria. UPEP was negative. CBC from 02/26/2017 showed white count of 9.2, H&H of 9.4/30 and platelet count of 96. Serum calcium was normal at 8.9.CBC from November 2017 showed H&H of 10.3/30.9.  Results of bloodwork from 03/09/2017 were as follows: CBC showed white count of 9.6, H&H of 10.3/29.9 and a platelet count of 301. CMP was significant for BUN of 39 and creatinine of 1.56. Reticulocyte count was 1.4% and low for the degree of anemia. Ferritin was normal at 51 and iron studies are within normal limits. B12 and folate were within normal limits. TSH was normal at 1.49. Haptoglobin was normal at 187. ESR was elevated at 70. Multiple myeloma panel showed elevated IgG of 1000 852. Serum monoclonal protein of 0.7 g was noted. Lambda light chain specificity. LDH was normal at 135 and beta-2 microglobulin was elevated at 4.2. 24-hour urine protein revealed 1gm proteinuria and 39 mg monoclonal protein in 24 hours.   Bone marrow biopsy on 04/20/17 showed normocellular marrow with trilineage hematopoiesis and mild increase in plasma cells 9% (5-10%)  Interval history- she is doing well since last visit. She is trying to regulate her diet to control her diabetes   Review of systems- Review of Systems  Constitutional:  Negative for chills, fever, malaise/fatigue and weight loss.  HENT: Negative for congestion, ear discharge and nosebleeds.   Eyes: Negative for blurred vision.  Respiratory: Negative for cough, hemoptysis, sputum production, shortness of breath and wheezing.   Cardiovascular: Negative for chest pain, palpitations, orthopnea and claudication.  Gastrointestinal: Negative for abdominal pain, blood in stool, constipation, diarrhea, heartburn, melena, nausea and vomiting.  Genitourinary: Negative for dysuria, flank pain, frequency, hematuria and urgency.  Musculoskeletal: Positive for back pain. Negative for joint pain and myalgias.  Skin: Negative for rash.  Neurological: Negative for dizziness, tingling, focal weakness, seizures, weakness and headaches.  Endo/Heme/Allergies: Does not bruise/bleed easily.  Psychiatric/Behavioral: Negative for depression and suicidal ideas. The patient does not have insomnia.      Current treatment- observation  No Known Allergies   Past Medical History:  Diagnosis Date  . Depression   . Diabetes mellitus without complication ([place])   . Hypercholesteremia   . Hypertension   . Schizophrenia ([place])    pt denies this dx     Past Surgical History:  Procedure Laterality Date  . CATARACT EXTRACTION W/PHACO Left 03/11/2016   Procedure: CATARACT EXTRACTION PHACO AND INTRAOCULAR LENS PLACEMENT (IOC);  Surgeon: Leandrew Koyanagi, MD;  Location: [place];  Service: Ophthalmology;  Laterality: Left;  DIABETIC - insulin  . ORIF TIBIA PLATEAU Right 09/05/06   [facility]    Social History   Social History  . Marital status: Divorced    Spouse name: N/A  . Number of children: N/A  . Years of education: N/A   Occupational History  .  Not on file.   Social History Main Topics  . Smoking status: Former Smoker    Packs/day: 0.25    Years: 5.00  . Smokeless tobacco: Current User  . Alcohol use No  . Drug use: No  . Sexual activity: No   Other Topics  Concern  . Not on file   Social History Narrative  . No narrative on file    No family history on file.   Current Outpatient Prescriptions:  .  amLODipine (NORVASC) 5 MG tablet, Take 5 mg by mouth daily., Disp: , Rfl:  .  B-D UF III MINI PEN NEEDLES 31G X 5 MM MISC, , Disp: , Rfl:  .  Cholecalciferol (VITAMIN D PO), Take by mouth., Disp: , Rfl:  .  insulin aspart (NOVOLOG) 100 UNIT/ML injection, Inject 14 Units into the skin once. , Disp: , Rfl:  .  insulin glargine (LANTUS) 100 UNIT/ML injection, Inject 10 Units into the skin at bedtime. , Disp: , Rfl:  .  LANTUS SOLOSTAR 100 UNIT/ML Solostar Pen, , Disp: , Rfl:  .  lisinopril (PRINIVIL,ZESTRIL) 20 MG tablet, Take 20 mg by mouth 2 (two) times daily., Disp: , Rfl:  .  lovastatin (MEVACOR) 40 MG tablet, Take 40 mg by mouth 2 (two) times daily., Disp: , Rfl:  .  METOPROLOL TARTRATE PO, Take 100 mg by mouth daily. , Disp: , Rfl:  .  NOVOLOG FLEXPEN 100 UNIT/ML FlexPen, , Disp: , Rfl:  .  TRUE METRIX BLOOD GLUCOSE TEST test strip, , Disp: , Rfl:  .  TRUEPLUS LANCETS 33G MISC, , Disp: , Rfl:   Physical exam:  Vitals:   04/27/17 1131  BP: (!) 149/75  Pulse: 61  Resp: 18  Temp: (!) 94.9 F (34.9 C)  TempSrc: Tympanic  Weight: 208 lb 12.8 oz (94.7 kg)   Physical Exam  Constitutional: She is oriented to person, place, and time.  Obese, appears in no acute distress  HENT:  Head: Normocephalic and atraumatic.  Eyes: EOM are normal. Pupils are equal, round, and reactive to light.  Neck: Normal range of motion.  Cardiovascular: Normal rate, regular rhythm and normal heart sounds.   Pulmonary/Chest: Effort normal and breath sounds normal.  Abdominal: Soft. Bowel sounds are normal.  Neurological: She is alert and oriented to person, place, and time.  Skin: Skin is warm and dry.     CMP Latest Ref Rng & Units 03/09/2017  Glucose 65 - 99 mg/dL 220(H)  BUN 6 - 20 mg/dL 39(H)  Creatinine 0.44 - 1.00 mg/dL 1.56(H)  Sodium 135 - 145  mmol/L 134(L)  Potassium 3.5 - 5.1 mmol/L 4.5  Chloride 101 - 111 mmol/L 103  CO2 22 - 32 mmol/L 26  Calcium 8.9 - 10.3 mg/dL 8.8(L)  Total Protein 6.5 - 8.1 g/dL 7.7  Total Bilirubin 0.3 - 1.2 mg/dL 0.4  Alkaline Phos 38 - 126 U/L 69  AST 15 - 41 U/L 21  ALT 14 - 54 U/L 11(L)   CBC Latest Ref Rng & Units 04/20/2017  WBC 3.6 - 11.0 K/uL 7.8  Hemoglobin 12.0 - 16.0 g/dL 10.3(L)  Hematocrit 35.0 - 47.0 % 30.4(L)  Platelets 150 - 440 K/uL 311    No images are attached to the encounter.  Dg Bone Survey Met  Result Date: 04/07/2017 CLINICAL DATA:  Monoclonal gammopathy of unknown significance EXAM: METASTATIC BONE SURVEY COMPARISON:  None. FINDINGS: Frontal view of the chest shows no lytic or blastic lesions. Mild mid and lower thoracic  dextroscoliosis. Cervical spine shows no lytic or blastic lesion. There is disc space flattening with anterior spurring at C5-C6 and C6-C7 level. Lateral view of the skull shows no lytic or blastic lesions. Bilateral humerus and bilateral shoulder shows no lytic or blastic lesions. Bilateral forearm there is no lytic or blastic lesion. No lytic or blastic lesions are noted thoracic spine. No lytic or blastic lesions are noted lumbar spine. There is mild anterolisthesis about 4 mm L4 on L5 vertebral body. Mild facet degenerative changes noted L4 and L5 level. Frontal view of the pelvis shows no lytic or blastic lesions. Bilateral femur shows no lytic or blastic lesion. Atherosclerotic calcifications are noted bilateral femoral artery. Degenerative changes are noted SI joints and pubic symphysis. Bilateral tibia and fibula shows no lytic or blastic lesions. There is metallic fixation plate and screws in proximal right tibia. IMPRESSION: No lytic or blastic lesions are identified. There is dextroscoliosis mid and lower thoracic spine. Degenerative changes are noted cervical spine. Degenerative changes are noted lower lumbar spine. Degenerative changes are noted bilateral  SI joints and pubic symphysis. Postsurgical changes with metallic fixation plate and screws in proximal right tibia. Electronically Signed   By: Lahoma Crocker M.D.   On: 04/07/2017 11:48   Ct Bone Marrow Biopsy  Result Date: 04/20/2017 INDICATION: Monoclonal gammopathy of unknown significance. EXAM: CT-guided bone marrow biopsy and aspiration. MEDICATIONS: None. ANESTHESIA/SEDATION: Moderate (conscious) sedation was employed during this procedure. A total of Versed 3.0 mg and Fentanyl 100 mcg was administered intravenously. Moderate Sedation Time: 30 minutes. The patient's level of consciousness and vital signs were monitored continuously by radiology nursing throughout the procedure under my direct supervision. COMPLICATIONS: None immediate. PROCEDURE: Informed written consent was obtained from the patient after a thorough discussion of the procedural risks, benefits and alternatives. All questions were addressed. Sterile Barrier Technique was utilized including mask, sterile gloves, sterile drape, hand hygiene and skin antiseptic. A timeout was performed prior to the initiation of the procedure. The patient was placed prone on CT scanner. Left posterior flank region was prepped and draped using sterile technique. Local anesthetic was applied. Under CT guidance, 22 gauge spinal needle was directed to the posterior margin of left iliac bone. Lidocaine was injected subperiosteally. Needle was then removed. Then, also under CT guidance, trocar was directed through the posterior margin of left iliac bone. Bone marrow aspirates were obtained and delivered to pathology with and without heparin. Then, bone marrow core biopsy was obtained and delivered to pathology. Needle was removed and appropriate dressing was applied. IMPRESSION: Under CT guidance, successful bone marrow aspiration and core biopsy of left iliac bone. Electronically Signed   By: Marijo Conception, M.D.   On: 04/20/2017 11:27     Assessment and plan-  Patient is a 66 y.o. female referred to [place] for monclonal protein in serum likely MGUS  I discussed the results of bone marrow biopsy with the patient. Overall patient has a small amount of monoclonal protein in her serum 0.9 gm of IGG lambda. She has significant proteinuria of 1 gm but monoclonal protein is only about 35 mg. Skeletal survey shows no lytic lesions. Her normocytic anemia is likely from her CKD. I do not have convincing evidence that she has overt myeloma yet. Her ckd could be from her diabetes. At this point I will monitor her spep, urine protein and free light chains as well as anemia and CKD closely and see her back in 3 months. If her kidney functions  or proteinuria worsen, I would recommend a kidney biopsy to ascertain the etiology.  I have discussed all this Dr. Murlean Iba as well  I will see her back in 3 months   Visit Diagnosis 1. Monoclonal gammopathy of unknown significance (MGUS)      Dr. Randa Evens, MD, MPH [facility] at [facility] Pager- [phone] 04/27/2017 1:12 PM

## 2017-04-30 LAB — TISSUE HYBRIDIZATION (BONE MARROW)-NCBH

## 2017-04-30 LAB — CHROMOSOME ANALYSIS, BONE MARROW

## 2017-05-11 ENCOUNTER — Encounter (HOSPITAL_COMMUNITY): Payer: Self-pay

## 2017-07-21 DIAGNOSIS — D472 Monoclonal gammopathy: Secondary | ICD-10-CM | POA: Insufficient documentation

## 2017-07-22 ENCOUNTER — Other Ambulatory Visit: Payer: Self-pay | Admitting: *Deleted

## 2017-07-22 DIAGNOSIS — D472 Monoclonal gammopathy: Secondary | ICD-10-CM

## 2017-07-23 LAB — IFE+PROTEIN ELECTRO, 24-HR UR
% BETA, Urine: 10.4 %
ALPHA 1 URINE: 4.1 %
ALPHA 2 UR: 7.3 %
Albumin, U: 60.5 %
GAMMA GLOBULIN URINE: 17.6 %
Total Protein, Urine-Ur/day: 192 mg/24 hr — ABNORMAL HIGH (ref 30–150)
Total Protein, Urine: 19.7 mg/dL
Total Volume: 975

## 2017-07-29 ENCOUNTER — Encounter: Payer: Self-pay | Admitting: Oncology

## 2017-07-29 ENCOUNTER — Other Ambulatory Visit: Payer: Self-pay

## 2017-07-29 ENCOUNTER — Inpatient Hospital Stay: Payer: Medicare HMO | Attending: Oncology

## 2017-07-29 ENCOUNTER — Inpatient Hospital Stay: Payer: Medicare HMO | Admitting: Oncology

## 2017-07-29 ENCOUNTER — Inpatient Hospital Stay: Payer: Medicare HMO

## 2017-07-29 ENCOUNTER — Inpatient Hospital Stay (HOSPITAL_BASED_OUTPATIENT_CLINIC_OR_DEPARTMENT_OTHER): Payer: Medicare HMO | Admitting: Oncology

## 2017-07-29 VITALS — BP 127/74 | HR 62 | Temp 95.4°F | Resp 18 | Wt 196.2 lb

## 2017-07-29 DIAGNOSIS — D472 Monoclonal gammopathy: Secondary | ICD-10-CM | POA: Diagnosis present

## 2017-07-29 DIAGNOSIS — Z79899 Other long term (current) drug therapy: Secondary | ICD-10-CM

## 2017-07-29 DIAGNOSIS — F329 Major depressive disorder, single episode, unspecified: Secondary | ICD-10-CM | POA: Diagnosis not present

## 2017-07-29 DIAGNOSIS — R5383 Other fatigue: Secondary | ICD-10-CM

## 2017-07-29 DIAGNOSIS — N183 Chronic kidney disease, stage 3 (moderate): Secondary | ICD-10-CM | POA: Diagnosis not present

## 2017-07-29 DIAGNOSIS — Z794 Long term (current) use of insulin: Secondary | ICD-10-CM | POA: Diagnosis not present

## 2017-07-29 DIAGNOSIS — F209 Schizophrenia, unspecified: Secondary | ICD-10-CM | POA: Insufficient documentation

## 2017-07-29 DIAGNOSIS — Z87891 Personal history of nicotine dependence: Secondary | ICD-10-CM | POA: Insufficient documentation

## 2017-07-29 DIAGNOSIS — I129 Hypertensive chronic kidney disease with stage 1 through stage 4 chronic kidney disease, or unspecified chronic kidney disease: Secondary | ICD-10-CM | POA: Insufficient documentation

## 2017-07-29 DIAGNOSIS — E7800 Pure hypercholesterolemia, unspecified: Secondary | ICD-10-CM | POA: Insufficient documentation

## 2017-07-29 DIAGNOSIS — E119 Type 2 diabetes mellitus without complications: Secondary | ICD-10-CM

## 2017-07-29 LAB — CBC WITH DIFFERENTIAL/PLATELET
BASOS ABS: 0.1 10*3/uL (ref 0–0.1)
BASOS PCT: 1 %
Eosinophils Absolute: 0.1 10*3/uL (ref 0–0.7)
Eosinophils Relative: 1 %
HCT: 30.5 % — ABNORMAL LOW (ref 35.0–47.0)
HEMOGLOBIN: 10.3 g/dL — AB (ref 12.0–16.0)
Lymphocytes Relative: 27 %
Lymphs Abs: 2.7 10*3/uL (ref 1.0–3.6)
MCH: 30 pg (ref 26.0–34.0)
MCHC: 33.9 g/dL (ref 32.0–36.0)
MCV: 88.4 fL (ref 80.0–100.0)
MONOS PCT: 6 %
Monocytes Absolute: 0.6 10*3/uL (ref 0.2–0.9)
NEUTROS ABS: 6.4 10*3/uL (ref 1.4–6.5)
NEUTROS PCT: 65 %
Platelets: 303 10*3/uL (ref 150–440)
RBC: 3.45 MIL/uL — AB (ref 3.80–5.20)
RDW: 13.1 % (ref 11.5–14.5)
WBC: 9.9 10*3/uL (ref 3.6–11.0)

## 2017-07-29 LAB — COMPREHENSIVE METABOLIC PANEL
ALBUMIN: 3.7 g/dL (ref 3.5–5.0)
ALK PHOS: 74 U/L (ref 38–126)
ALT: 11 U/L — ABNORMAL LOW (ref 14–54)
AST: 18 U/L (ref 15–41)
Anion gap: 7 (ref 5–15)
BUN: 78 mg/dL — ABNORMAL HIGH (ref 6–20)
CO2: 20 mmol/L — AB (ref 22–32)
Calcium: 9.1 mg/dL (ref 8.9–10.3)
Chloride: 104 mmol/L (ref 101–111)
Creatinine, Ser: 2.34 mg/dL — ABNORMAL HIGH (ref 0.44–1.00)
GFR calc Af Amer: 24 mL/min — ABNORMAL LOW (ref >60)
GFR calc non Af Amer: 21 mL/min — ABNORMAL LOW (ref >60)
GLUCOSE: 72 mg/dL (ref 65–99)
Potassium: 5 mmol/L (ref 3.5–5.1)
SODIUM: 131 mmol/L — AB (ref 135–145)
Total Bilirubin: 0.5 mg/dL (ref 0.3–1.2)
Total Protein: 7.8 g/dL (ref 6.5–8.1)

## 2017-07-29 NOTE — Progress Notes (Signed)
Hematology/Oncology Consult note [facility]  [phone] [phone]  Patient Care Team: Ellamae Sia, MD as PCP - General (Internal Medicine)   Name of the patient: Jennifer Wood  [id number]  [birth year]   Date of visit: 07/29/17  Diagnosis- IgG MGUS  Chief complaint/ Reason for visit- routine f/u  Heme/Onc history: Patient is a 66 year old female who sees Dr. Candiss Norse for her CKD. She has long-standing history of hypertension and diabetes as well. Blood work from 11/12/2016 showed hemoglobin of 10.3. BUN and creatinine were elevated at 49/1.54. Patient was noted to have an evidence of 0.9 g as well as 2+ proteinuria. UPEP was negative. CBC from 02/26/2017 showed white count of 9.2, H&H of 9.4/30 and platelet count of 96. Serum calcium was normal at 8.9.CBC from November 2017 showed H&H of 10.3/30.9.  Results of bloodwork from 03/09/2017 were as follows: CBC showed white count of 9.6, H&H of 10.3/29.9 and a platelet count of 301. CMP was significant for BUN of 39 and creatinine of 1.56. Reticulocyte count was 1.4% and low for the degree of anemia. Ferritin was normal at 51 and iron studies are within normal limits. B12 and folate were within normal limits. TSH was normal at 1.49. Haptoglobin was normal at 187. ESR was elevated at 70. Multiple myeloma panel showed elevated IgG of 1000 852. Serum monoclonal protein of 0.7 g was noted. Lambda light chain specificity. LDH was normal at 135 and beta-2 microglobulin was elevated at 4.2. 24-hour urine protein revealed 1gm proteinuria and 39 mg monoclonal protein in 24 hours.   Bone marrow biopsy on 04/20/17 showed normocellular marrow with trilineage hematopoiesis and mild increase in plasma cells 9% (5-10%)   Interval history- reports some fatigue. Denies other complaints  ECOG PS- 1 Pain scale- 0  Review of systems- Review of Systems  Constitutional: Negative for chills, fever, malaise/fatigue  and weight loss.  HENT: Negative for congestion, ear discharge and nosebleeds.   Eyes: Negative for blurred vision.  Respiratory: Negative for cough, hemoptysis, sputum production, shortness of breath and wheezing.   Cardiovascular: Negative for chest pain, palpitations, orthopnea and claudication.  Gastrointestinal: Negative for abdominal pain, blood in stool, constipation, diarrhea, heartburn, melena, nausea and vomiting.  Genitourinary: Negative for dysuria, flank pain, frequency, hematuria and urgency.  Musculoskeletal: Negative for back pain, joint pain and myalgias.  Skin: Negative for rash.  Neurological: Negative for dizziness, tingling, focal weakness, seizures, weakness and headaches.  Endo/Heme/Allergies: Does not bruise/bleed easily.  Psychiatric/Behavioral: Negative for depression and suicidal ideas. The patient does not have insomnia.       No Known Allergies   Past Medical History:  Diagnosis Date  . Depression   . Diabetes mellitus without complication ([place])   . Hypercholesteremia   . Hypertension   . Schizophrenia ([place])    pt denies this dx     Past Surgical History:  Procedure Laterality Date  . CATARACT EXTRACTION W/PHACO Left 03/11/2016   Procedure: CATARACT EXTRACTION PHACO AND INTRAOCULAR LENS PLACEMENT (IOC);  Surgeon: Leandrew Koyanagi, MD;  Location: [place];  Service: Ophthalmology;  Laterality: Left;  DIABETIC - insulin  . ORIF TIBIA PLATEAU Right 09/05/06   [facility]    Social History   Social History  . Marital status: Divorced    Spouse name: N/A  . Number of children: N/A  . Years of education: N/A   Occupational History  . Not on file.   Social History Main Topics  . Smoking  status: Former Smoker    Packs/day: 0.25    Years: 5.00  . Smokeless tobacco: Current User  . Alcohol use No  . Drug use: No  . Sexual activity: No   Other Topics Concern  . Not on file   Social History Narrative  . No narrative on file    No  family history on file.   Current Outpatient Prescriptions:  .  B-D UF III MINI PEN NEEDLES 31G X 5 MM MISC, , Disp: , Rfl:  .  Cholecalciferol (VITAMIN D PO), Take by mouth., Disp: , Rfl:  .  insulin aspart (NOVOLOG) 100 UNIT/ML injection, Inject 14 Units into the skin once. , Disp: , Rfl:  .  insulin glargine (LANTUS) 100 UNIT/ML injection, Inject 10 Units into the skin at bedtime. , Disp: , Rfl:  .  LANTUS SOLOSTAR 100 UNIT/ML Solostar Pen, , Disp: , Rfl:  .  lisinopril (PRINIVIL,ZESTRIL) 20 MG tablet, Take 20 mg by mouth 2 (two) times daily., Disp: , Rfl:  .  lovastatin (MEVACOR) 40 MG tablet, Take 40 mg by mouth 2 (two) times daily., Disp: , Rfl:  .  METOPROLOL TARTRATE PO, Take 100 mg by mouth daily. , Disp: , Rfl:  .  NOVOLOG FLEXPEN 100 UNIT/ML FlexPen, , Disp: , Rfl:  .  TRUE METRIX BLOOD GLUCOSE TEST test strip, , Disp: , Rfl:  .  TRUEPLUS LANCETS 33G MISC, , Disp: , Rfl:  .  amLODipine (NORVASC) 5 MG tablet, Take 5 mg by mouth daily., Disp: , Rfl:   Physical exam:  Vitals:   07/29/17 1158  BP: 127/74  Pulse: 62  Resp: 18  Temp: (!) 95.4 F (35.2 C)  Weight: 196 lb 3 oz (89 kg)   Physical Exam  Constitutional: She is oriented to person, place, and time and well-developed, well-nourished, and in no distress.  HENT:  Head: Normocephalic and atraumatic.  Eyes: Pupils are equal, round, and reactive to light. EOM are normal.  Neck: Normal range of motion.  Cardiovascular: Normal rate, regular rhythm and normal heart sounds.   Pulmonary/Chest: Effort normal and breath sounds normal.  Abdominal: Soft. Bowel sounds are normal.  Neurological: She is alert and oriented to person, place, and time.  Skin: Skin is warm and dry.     CMP Latest Ref Rng & Units 07/29/2017  Glucose 65 - 99 mg/dL 72  BUN 6 - 20 mg/dL 78(H)  Creatinine 0.44 - 1.00 mg/dL 2.34(H)  Sodium 135 - 145 mmol/L 131(L)  Potassium 3.5 - 5.1 mmol/L 5.0  Chloride 101 - 111 mmol/L 104  CO2 22 - 32 mmol/L 20(L)    Calcium 8.9 - 10.3 mg/dL 9.1  Total Protein 6.5 - 8.1 g/dL 7.8  Total Bilirubin 0.3 - 1.2 mg/dL 0.5  Alkaline Phos 38 - 126 U/L 74  AST 15 - 41 U/L 18  ALT 14 - 54 U/L 11(L)   CBC Latest Ref Rng & Units 07/29/2017  WBC 3.6 - 11.0 K/uL 9.9  Hemoglobin 12.0 - 16.0 g/dL 10.3(L)  Hematocrit 35.0 - 47.0 % 30.5(L)  Platelets 150 - 440 K/uL 303     Assessment and plan- Patient is a 66 y.o. female with IgG MGUS  Her hemoglobin is stable around 10. No evidence of hypercalcemia. Her kidney functions are somewhat worse today and her creatinine is up to 2. 34. Multiple myeloma panel and free light chain ratio from today is pending. She did have a repeat 24 hour protein and electrophoresis done a  week ago which showed that her total protein in her urine had come down to 192 mg from 1 g 4 months prior. IgG monoclonal protein with lambda light chain specificity was noted. Patient has already had a bone marrow biopsy which shows about 9% plasma cells. Skeletal survey was negative or any lytic lesions.  Based on her peripheral blood work, she has a small amount of M protein in her serum of 0.7 gm IgG lambda. 9% plasma cells in her Bm. This is still not consistent with overt multiple myeloma. I will repeat cbc, cmp, myeloma panel, 24 hour protein in  Urine and FLC in 3 months. If her myeloma labs appear stable but kidney functions are worse, it may be worthwhile to get kidney biopsy to asecrtain the etiology of CKD- if myeloma or HTN.   Visit Diagnosis 1. MGUS (monoclonal gammopathy of unknown significance)      Dr. Randa Evens, MD, MPH [facility] at [facility] Pager- [phone] 07/29/2017 2:51 PM

## 2017-07-29 NOTE — Progress Notes (Signed)
Patient offers no complaints today. 

## 2017-07-30 LAB — KAPPA/LAMBDA LIGHT CHAINS
KAPPA FREE LGHT CHN: 85.7 mg/L — AB (ref 3.3–19.4)
Kappa, lambda light chain ratio: 0.74 (ref 0.26–1.65)
LAMDA FREE LIGHT CHAINS: 115.3 mg/L — AB (ref 5.7–26.3)

## 2017-08-02 ENCOUNTER — Other Ambulatory Visit: Payer: Self-pay | Admitting: Internal Medicine

## 2017-08-02 DIAGNOSIS — Z1231 Encounter for screening mammogram for malignant neoplasm of breast: Secondary | ICD-10-CM

## 2017-08-02 LAB — MULTIPLE MYELOMA PANEL, SERUM
ALPHA2 GLOB SERPL ELPH-MCNC: 0.8 g/dL (ref 0.4–1.0)
Albumin SerPl Elph-Mcnc: 3.3 g/dL (ref 2.9–4.4)
Albumin/Glob SerPl: 0.8 (ref 0.7–1.7)
Alpha 1: 0.2 g/dL (ref 0.0–0.4)
B-Globulin SerPl Elph-Mcnc: 1.1 g/dL (ref 0.7–1.3)
GAMMA GLOB SERPL ELPH-MCNC: 2.1 g/dL — AB (ref 0.4–1.8)
GLOBULIN, TOTAL: 4.2 g/dL — AB (ref 2.2–3.9)
IGA: 220 mg/dL (ref 87–352)
IGG (IMMUNOGLOBIN G), SERUM: 2128 mg/dL — AB (ref 700–1600)
IgM, Serum: 130 mg/dL (ref 26–217)
M Protein SerPl Elph-Mcnc: 0.9 g/dL — ABNORMAL HIGH
Total Protein ELP: 7.5 g/dL (ref 6.0–8.5)

## 2017-08-16 ENCOUNTER — Emergency Department: Payer: Medicare HMO

## 2017-08-16 ENCOUNTER — Emergency Department
Admission: EM | Admit: 2017-08-16 | Discharge: 2017-08-16 | Disposition: A | Discharge status: Home / Self Care | Payer: Medicare HMO | Attending: Emergency Medicine | Admitting: Emergency Medicine

## 2017-08-16 DIAGNOSIS — N390 Urinary tract infection, site not specified: Secondary | ICD-10-CM | POA: Diagnosis not present

## 2017-08-16 DIAGNOSIS — I10 Essential (primary) hypertension: Secondary | ICD-10-CM | POA: Insufficient documentation

## 2017-08-16 DIAGNOSIS — E119 Type 2 diabetes mellitus without complications: Secondary | ICD-10-CM | POA: Diagnosis not present

## 2017-08-16 DIAGNOSIS — Z87891 Personal history of nicotine dependence: Secondary | ICD-10-CM | POA: Diagnosis not present

## 2017-08-16 DIAGNOSIS — K859 Acute pancreatitis without necrosis or infection, unspecified: Secondary | ICD-10-CM | POA: Diagnosis not present

## 2017-08-16 DIAGNOSIS — R110 Nausea: Secondary | ICD-10-CM | POA: Insufficient documentation

## 2017-08-16 DIAGNOSIS — Z794 Long term (current) use of insulin: Secondary | ICD-10-CM | POA: Insufficient documentation

## 2017-08-16 DIAGNOSIS — R748 Abnormal levels of other serum enzymes: Secondary | ICD-10-CM | POA: Insufficient documentation

## 2017-08-16 DIAGNOSIS — R109 Unspecified abdominal pain: Secondary | ICD-10-CM | POA: Diagnosis present

## 2017-08-16 LAB — CBC
HCT: 28.9 % — ABNORMAL LOW (ref 35.0–47.0)
Hemoglobin: 10.3 g/dL — ABNORMAL LOW (ref 12.0–16.0)
MCH: 31 pg (ref 26.0–34.0)
MCHC: 35.5 g/dL (ref 32.0–36.0)
MCV: 87.2 fL (ref 80.0–100.0)
PLATELETS: 268 10*3/uL (ref 150–440)
RBC: 3.32 MIL/uL — ABNORMAL LOW (ref 3.80–5.20)
RDW: 13.3 % (ref 11.5–14.5)
WBC: 8.3 10*3/uL (ref 3.6–11.0)

## 2017-08-16 LAB — GLUCOSE, CAPILLARY: Glucose-Capillary: 106 mg/dL — ABNORMAL HIGH (ref 65–99)

## 2017-08-16 LAB — COMPREHENSIVE METABOLIC PANEL
ALK PHOS: 75 U/L (ref 38–126)
ALT: 12 U/L — ABNORMAL LOW (ref 14–54)
AST: 18 U/L (ref 15–41)
Albumin: 3.8 g/dL (ref 3.5–5.0)
Anion gap: 7 (ref 5–15)
BUN: 52 mg/dL — ABNORMAL HIGH (ref 6–20)
CO2: 25 mmol/L (ref 22–32)
CREATININE: 1.76 mg/dL — AB (ref 0.44–1.00)
Calcium: 9.7 mg/dL (ref 8.9–10.3)
Chloride: 105 mmol/L (ref 101–111)
GFR calc Af Amer: 34 mL/min — ABNORMAL LOW (ref >60)
GFR, EST NON AFRICAN AMERICAN: 29 mL/min — AB (ref >60)
GLUCOSE: 62 mg/dL — AB (ref 65–99)
Potassium: 4.2 mmol/L (ref 3.5–5.1)
Sodium: 137 mmol/L (ref 135–145)
Total Bilirubin: 0.7 mg/dL (ref 0.3–1.2)
Total Protein: 8.3 g/dL — ABNORMAL HIGH (ref 6.5–8.1)

## 2017-08-16 LAB — URINALYSIS, COMPLETE (UACMP) WITH MICROSCOPIC
BILIRUBIN URINE: NEGATIVE
Glucose, UA: NEGATIVE mg/dL
Ketones, ur: NEGATIVE mg/dL
NITRITE: NEGATIVE
PH: 5 (ref 5.0–8.0)
Protein, ur: 30 mg/dL — AB
Specific Gravity, Urine: 1.004 — ABNORMAL LOW (ref 1.005–1.030)

## 2017-08-16 LAB — LIPASE, BLOOD: Lipase: 129 U/L — ABNORMAL HIGH (ref 11–51)

## 2017-08-16 MED ORDER — SODIUM CHLORIDE 0.9 % IV BOLUS (SEPSIS)
1000.0000 mL | Freq: Once | INTRAVENOUS | Status: AC
  Administered 2017-08-16: 1000 mL via INTRAVENOUS

## 2017-08-16 MED ORDER — NITROFURANTOIN MONOHYD MACRO 100 MG PO CAPS: 100.0000 mg | ORAL_CAPSULE | Freq: Two times a day (BID) | ORAL | 0 refills | Status: AC

## 2017-08-16 MED ORDER — IOPAMIDOL (ISOVUE-300) INJECTION 61%
15.0000 mL | INTRAVENOUS | Status: AC
  Administered 2017-08-16 (×2): 15 mL via ORAL
  Filled 2017-08-16 (×2): qty 15

## 2017-08-16 NOTE — ED Triage Notes (Signed)
Per EMS report, patient complains of abdominal pain, nausea and headaches for months. Patient contacted Medicare which referred her to the ED. NAD.

## 2017-08-16 NOTE — ED Triage Notes (Signed)
Pt presents to ED for headache at forehead. Denies blurred vision. Recent cataract surgery. States she feels faint. Pt states general abdominal pain after she eats. Pt denies pain currently. Alert, oriented, ambulatory. Keeps saying "they check my urine over at the cancer center for protein in my urine."

## 2017-08-16 NOTE — ED Provider Notes (Signed)
[facility] Emergency Department Provider Note  ____________________________________________   I have reviewed the triage vital signs and the nursing notes.   HISTORY  Chief Complaint Abdominal Pain and Headache    HPI Jennifer Wood is a 66 y.o. female presents with multiple different complaints. She does have a history of diabetes, cholesterol hypertension and was reported to be schizophrenia. Patient states that she has had nausea for over a year and she wants to be evaluated for that. She denies weight loss or vomiting. She denies any abdominal pain. She does have known chronic kidney disease. She denies any difficulty with stooling or change in her bowel habits. She denies any hematemesis bright red blood per rectum or melena. She states sometimes she feels "swimmy headed" she is not feeling that right now but she did earlier, this has been going on for a year as well. Very vaguely described. Denies any headache. Denies any focal numbness or weakness. He states that she did have protein in her urine at one time, and this is something is been a great preoccupation to her.Likely to check and see if it still there. Denies urinary symptoms however.  Essentially therefore she is here for nausea for well over a year and occasional swimmy headed feeling.   Past Medical History:  Diagnosis Date  . Depression   . Diabetes mellitus without complication ([place])   . Hypercholesteremia   . Hypertension   . Schizophrenia [facility])    pt denies this dx    Patient Active Problem List   Diagnosis Date Noted  . Schizophrenia ([place]) 03/09/2017  . Hypertension 03/09/2017  . Diabetes ([place]) 03/09/2017  . Depression 03/09/2017    Past Surgical History:  Procedure Laterality Date  . CATARACT EXTRACTION W/PHACO Left 03/11/2016   Procedure: CATARACT EXTRACTION PHACO AND INTRAOCULAR LENS PLACEMENT (IOC);  Surgeon: Leandrew Koyanagi, MD;  Location: [facility];  Service:  Ophthalmology;  Laterality: Left;  DIABETIC - insulin  . ORIF TIBIA PLATEAU Right 09/05/06   [facility]  . TUBAL LIGATION      Prior to Admission medications   Medication Sig Start Date End Date Taking? Authorizing Provider  amLODipine (NORVASC) 5 MG tablet Take 5 mg by mouth daily.    [provider]  B-D UF III MINI PEN NEEDLES 31G X 5 MM MISC  12/25/16   [provider]  Cholecalciferol (VITAMIN D PO) Take by mouth.    [provider]  insulin aspart (NOVOLOG) 100 UNIT/ML injection Inject 14 Units into the skin once.     [provider]  insulin glargine (LANTUS) 100 UNIT/ML injection Inject 10 Units into the skin at bedtime.     [provider]  LANTUS SOLOSTAR 100 UNIT/ML Solostar Pen  12/25/16   [provider]  lisinopril (PRINIVIL,ZESTRIL) 20 MG tablet Take 20 mg by mouth 2 (two) times daily.    [provider]  lovastatin (MEVACOR) 40 MG tablet Take 40 mg by mouth 2 (two) times daily.    [provider]  METOPROLOL TARTRATE PO Take 100 mg by mouth daily.     [provider]  NOVOLOG FLEXPEN 100 UNIT/ML FlexPen  12/25/16   [provider]  TRUE METRIX BLOOD GLUCOSE TEST test strip  12/25/16   [provider]  TRUEPLUS LANCETS 33G [place]  12/25/16   [provider]    Allergies Tomato  History reviewed. No pertinent family history.  Social History Social History  Substance Use Topics  .  Smoking status: Former Smoker    Packs/day: 0.25    Years: 5.00  . Smokeless tobacco: Current User  . Alcohol use No    Review of Systems Constitutional: No fever/chills Eyes: No visual changes. ENT: No sore throat. No stiff neck no neck pain Cardiovascular: Denies chest pain. Respiratory: Denies shortness of breath. Gastrointestinal:   no vomiting.  No diarrhea.  No constipation. Genitourinary: Negative for dysuria. Musculoskeletal: Negative lower extremity swelling Skin: Negative for  rash. Neurological: Negative for severe headaches, focal weakness or numbness.   ____________________________________________   PHYSICAL EXAM:  VITAL SIGNS: ED Triage Vitals [08/16/17 1343]  Enc Vitals Group     BP (!) 149/67     Pulse Rate 77     Resp 18     Temp 98.4 F (36.9 C)     Temp Source Oral     SpO2 99 %     Weight      Height      Head Circumference      Peak Flow      Pain Score      Pain Loc      Pain Edu?      Excl. in [place]?     Constitutional: Alert and oriented. Well appearing and in no acute distress. Eyes: Conjunctivae are normal Head: Atraumatic HEENT: No congestion/rhinnorhea. Mucous membranes are moist.  Oropharynx non-erythematous Neck:   Nontender with no meningismus, no masses, no stridor Cardiovascular: Normal rate, regular rhythm. Grossly normal heart sounds.  Good peripheral circulation. Respiratory: Normal respiratory effort.  No retractions. Lungs CTAB. Abdominal: Soft and nontender. No distention. No guarding no rebound Back:  There is no focal tenderness or step off.  there is no midline tenderness there are no lesions noted. there is no CVA tenderness Musculoskeletal: No lower extremity tenderness, no upper extremity tenderness. No joint effusions, no DVT signs strong distal pulses no edema Neurologic:  Normal speech and language. No gross focal neurologic deficits are appreciated.  Skin:  Skin is warm, dry and intact. No rash noted. Psychiatric: Mood and affect are normal. Speech and behavior are normal.  ____________________________________________   LABS (all labs ordered are listed, but only abnormal results are displayed)  Labs Reviewed  LIPASE, BLOOD - Abnormal; Notable for the following:       Result Value   Lipase 129 (*)    All other components within normal limits  COMPREHENSIVE METABOLIC PANEL - Abnormal; Notable for the following:    Glucose, Bld 62 (*)    BUN 52 (*)    Creatinine, Ser 1.76 (*)    Total Protein 8.3  (*)    ALT 12 (*)    GFR calc non Af Amer 29 (*)    GFR calc Af Amer 34 (*)    All other components within normal limits  CBC - Abnormal; Notable for the following:    RBC 3.32 (*)    Hemoglobin 10.3 (*)    HCT 28.9 (*)    All other components within normal limits  URINALYSIS, COMPLETE (UACMP) WITH MICROSCOPIC - Abnormal; Notable for the following:    Color, Urine STRAW (*)    APPearance HAZY (*)    Specific Gravity, Urine 1.004 (*)    Hgb urine dipstick SMALL (*)    Protein, ur 30 (*)    Leukocytes, UA MODERATE (*)    Bacteria, UA RARE (*)    Squamous Epithelial / LPF 0-5 (*)    All other components within normal limits  ____________________________________________  EKG  I personally interpreted any EKGs ordered by me or triage Sinus rhythm, bradycardia noted, no acute ST elevation or depression, right bundle-branch block noted, no acute ischemia ____________________________________________  RADIOLOGY  I reviewed any imaging ordered by me or triage that were performed during my shift and, if possible, patient and/or family made aware of any abnormal findings. ____________________________________________   PROCEDURES  Procedure(s) performed: None  Procedures  Critical Care performed: None  ____________________________________________   INITIAL IMPRESSION / ASSESSMENT AND PLAN / ED COURSE  Pertinent labs & imaging results that were available during my care of the patient were reviewed by me and considered in my medical decision making (see chart for details).  Patient here with a host of very on usual and unrelated complaints. Because of her mild elevation in lipase I did do a CT scan to make sure there is no evidence of pancreatic cancer or other such pathology and it is reassuringly negative as expected. Her exam is quite benign. I don't think she requires a CT is no evidence of stroke or bleed or other acute intracranial pathology. She is very reassured by our  findings and requesting discharge. She is tolerating by mouth with no difficulty here. She may have a small urinary tract infection, we will send her home with antibiotics and a urine culture. Otherwise, reassuring exam and workup. Work term precautions and follow-up given and understood and I have instructed her to follow closely with her doctor for further evaluation of her pancreatitis. Patient is asymptomatic with a pulse glucose of 62 on her blood work however we will make sure that we recheck that prior to discharge    ____________________________________________   FINAL CLINICAL IMPRESSION(S) / ED DIAGNOSES  Final diagnoses:  None      This chart was dictated using voice recognition software.  Despite best efforts to proofread,  errors can occur which can change meaning.      Schuyler Amor, MD 08/16/17 [phone]

## 2017-08-16 NOTE — ED Notes (Signed)
Patient given snack and milk. Will monitor for nausea and pain.

## 2017-08-26 ENCOUNTER — Other Ambulatory Visit: Payer: Medicare HMO

## 2017-08-26 ENCOUNTER — Ambulatory Visit: Payer: Medicare HMO | Admitting: Oncology

## 2017-10-29 ENCOUNTER — Inpatient Hospital Stay: Payer: Medicare HMO | Attending: Oncology

## 2017-10-29 DIAGNOSIS — D472 Monoclonal gammopathy: Secondary | ICD-10-CM | POA: Insufficient documentation

## 2017-11-05 ENCOUNTER — Inpatient Hospital Stay: Payer: Medicare HMO

## 2017-11-05 ENCOUNTER — Encounter: Payer: Self-pay | Admitting: Oncology

## 2017-11-05 ENCOUNTER — Inpatient Hospital Stay: Payer: Medicare HMO | Attending: Oncology | Admitting: Oncology

## 2017-11-05 VITALS — BP 129/80 | HR 70 | Temp 96.1°F | Resp 16 | Wt 186.0 lb

## 2017-11-05 DIAGNOSIS — D472 Monoclonal gammopathy: Secondary | ICD-10-CM | POA: Insufficient documentation

## 2017-11-05 DIAGNOSIS — R5383 Other fatigue: Secondary | ICD-10-CM | POA: Insufficient documentation

## 2017-11-05 DIAGNOSIS — I129 Hypertensive chronic kidney disease with stage 1 through stage 4 chronic kidney disease, or unspecified chronic kidney disease: Secondary | ICD-10-CM | POA: Diagnosis not present

## 2017-11-05 DIAGNOSIS — E119 Type 2 diabetes mellitus without complications: Secondary | ICD-10-CM | POA: Diagnosis not present

## 2017-11-05 DIAGNOSIS — Z794 Long term (current) use of insulin: Secondary | ICD-10-CM | POA: Diagnosis not present

## 2017-11-05 DIAGNOSIS — E7800 Pure hypercholesterolemia, unspecified: Secondary | ICD-10-CM

## 2017-11-05 DIAGNOSIS — F329 Major depressive disorder, single episode, unspecified: Secondary | ICD-10-CM | POA: Diagnosis not present

## 2017-11-05 DIAGNOSIS — D631 Anemia in chronic kidney disease: Secondary | ICD-10-CM | POA: Diagnosis not present

## 2017-11-05 DIAGNOSIS — Z87891 Personal history of nicotine dependence: Secondary | ICD-10-CM

## 2017-11-05 DIAGNOSIS — N183 Chronic kidney disease, stage 3 (moderate): Secondary | ICD-10-CM | POA: Diagnosis not present

## 2017-11-05 DIAGNOSIS — E1122 Type 2 diabetes mellitus with diabetic chronic kidney disease: Secondary | ICD-10-CM | POA: Diagnosis not present

## 2017-11-05 LAB — COMPREHENSIVE METABOLIC PANEL
ALBUMIN: 3.5 g/dL (ref 3.5–5.0)
ALK PHOS: 68 U/L (ref 38–126)
ALT: 12 U/L — ABNORMAL LOW (ref 14–54)
ANION GAP: 5 (ref 5–15)
AST: 16 U/L (ref 15–41)
BILIRUBIN TOTAL: 0.5 mg/dL (ref 0.3–1.2)
BUN: 59 mg/dL — ABNORMAL HIGH (ref 6–20)
CALCIUM: 9 mg/dL (ref 8.9–10.3)
CO2: 24 mmol/L (ref 22–32)
Chloride: 105 mmol/L (ref 101–111)
Creatinine, Ser: 1.79 mg/dL — ABNORMAL HIGH (ref 0.44–1.00)
GFR calc Af Amer: 33 mL/min — ABNORMAL LOW (ref >60)
GFR calc non Af Amer: 29 mL/min — ABNORMAL LOW (ref >60)
GLUCOSE: 139 mg/dL — AB (ref 65–99)
Potassium: 4.6 mmol/L (ref 3.5–5.1)
SODIUM: 134 mmol/L — AB (ref 135–145)
TOTAL PROTEIN: 7.6 g/dL (ref 6.5–8.1)

## 2017-11-05 LAB — CBC WITH DIFFERENTIAL/PLATELET
Basophils Absolute: 0.1 10*3/uL (ref 0–0.1)
Basophils Relative: 1 %
EOS ABS: 0.1 10*3/uL (ref 0–0.7)
EOS PCT: 2 %
HCT: 28.2 % — ABNORMAL LOW (ref 35.0–47.0)
Hemoglobin: 9.4 g/dL — ABNORMAL LOW (ref 12.0–16.0)
LYMPHS PCT: 24 %
Lymphs Abs: 1.8 10*3/uL (ref 1.0–3.6)
MCH: 30.6 pg (ref 26.0–34.0)
MCHC: 33.1 g/dL (ref 32.0–36.0)
MCV: 92.3 fL (ref 80.0–100.0)
Monocytes Absolute: 0.4 10*3/uL (ref 0.2–0.9)
Monocytes Relative: 5 %
NEUTROS PCT: 68 %
Neutro Abs: 5 10*3/uL (ref 1.4–6.5)
PLATELETS: 283 10*3/uL (ref 150–440)
RBC: 3.06 MIL/uL — ABNORMAL LOW (ref 3.80–5.20)
RDW: 13.1 % (ref 11.5–14.5)
WBC: 7.3 10*3/uL (ref 3.6–11.0)

## 2017-11-05 NOTE — Progress Notes (Signed)
Patient here for follow up with lab results. She reports feeling pretty well and denies having any pain. Her regular MD took her off of two blood pressure meds, so she is only taking lisinopril now. She has lost ten pounds since August, but states that she has been trying.

## 2017-11-05 NOTE — Progress Notes (Signed)
Hematology/Oncology Consult note [facility]  Telephone:([phone] [phone]  Patient Care Team: Ellamae Sia, MD as PCP - General (Internal Medicine)   Name of the patient: Jennifer Wood  [id number]  1951-05-26   Date of visit: 11/05/17  Diagnosis- IgG MGUS  Chief complaint/ Reason for visit- routine f/u of MGUS  Heme/Onc history:Patient is a 66 year old female who sees Dr. Candiss Norse for her CKD. She has long-standing history of hypertension and diabetes as well. Blood work from 11/12/2016 showed hemoglobin of 10.3. BUN and creatinine were elevated at 49/1.54. Patient was noted to have an evidence of 0.9 g as well as 2+ proteinuria. UPEP was negative. CBC from 02/26/2017 showed white count of 9.2, H&H of 9.4/30 and platelet count of 96. Serum calcium was normal at 8.9.CBC from November 2017 showed H&H of 10.3/30.9.  Results of bloodwork from 03/09/2017 were as follows: CBC showed white count of 9.6, H&H of 10.3/29.9 and a platelet count of 301. CMP was significant for BUN of 39 and creatinine of 1.56. Reticulocyte count was 1.4% and low for the degree of anemia. Ferritin was normal at 51 and iron studies are within normal limits. B12 and folate were within normal limits. TSH was normal at 1.49. Haptoglobin was normal at 187. ESR was elevated at 70. Multiple myeloma panel showed elevated IgG of 1000 852. Serum monoclonal protein of 0.7 g was noted. Lambda light chain specificity. LDH was normal at 135 and beta-2 microglobulin was elevated at 4.2. 24-hour urine protein revealed 1gm proteinuria and 39 mg monoclonal protein in 24 hours.   Bone marrow biopsy on 04/20/17 showed normocellular marrow with trilineage hematopoiesis and mild increase in plasma cells 9% (5-10%)   Interval history- patient has been having some problems with her right eye and will be seeing ophthalmology next week. She could not come for blood work last week. She feels at her  baseline. Reports fatigue   ECOG PS- 1 Pain scale- 0   Review of systems- Review of Systems  Constitutional: Positive for malaise/fatigue. Negative for chills, fever and weight loss.  HENT: Negative for congestion, ear discharge and nosebleeds.   Eyes: Positive for pain. Negative for blurred vision.  Respiratory: Negative for cough, hemoptysis, sputum production, shortness of breath and wheezing.   Cardiovascular: Negative for chest pain, palpitations, orthopnea and claudication.  Gastrointestinal: Negative for abdominal pain, blood in stool, constipation, diarrhea, heartburn, melena, nausea and vomiting.  Genitourinary: Negative for dysuria, flank pain, frequency, hematuria and urgency.  Musculoskeletal: Positive for back pain. Negative for joint pain and myalgias.  Skin: Negative for rash.  Neurological: Negative for dizziness, tingling, focal weakness, seizures, weakness and headaches.  Endo/Heme/Allergies: Does not bruise/bleed easily.  Psychiatric/Behavioral: Negative for depression and suicidal ideas. The patient does not have insomnia.       Allergies  Allergen Reactions  . Tomato      Past Medical History:  Diagnosis Date  . Depression   . Diabetes mellitus without complication ([place])   . Hypercholesteremia   . Hypertension   . Schizophrenia ([place])    pt denies this dx     Past Surgical History:  Procedure Laterality Date  . ORIF TIBIA PLATEAU Right 09/05/06   [facility]  . TUBAL LIGATION      Social History   Socioeconomic History  . Marital status: Divorced    Spouse name: Not on file  . Number of children: Not on file  . Years of education: Not on file  .  Highest education level: Not on file  Social Needs  . Financial resource strain: Not on file  . Food insecurity - worry: Not on file  . Food insecurity - inability: Not on file  . Transportation needs - medical: Not on file  . Transportation needs - non-medical: Not on file  Occupational History  . Not  on file  Tobacco Use  . Smoking status: Former Smoker    Packs/day: 0.25    Years: 5.00    Pack years: 1.25  . Smokeless tobacco: Current User    Types: Snuff  Substance and Sexual Activity  . Alcohol use: No  . Drug use: No  . Sexual activity: No  Other Topics Concern  . Not on file  Social History Narrative  . Not on file    No family history on file.   Current Outpatient Medications:  .  B-D UF III MINI PEN NEEDLES 31G X 5 MM MISC, , Disp: , Rfl:  .  Cholecalciferol (VITAMIN D PO), Take by mouth., Disp: , Rfl:  .  insulin aspart (NOVOLOG) 100 UNIT/ML injection, Inject 14 Units into the skin once. , Disp: , Rfl:  .  insulin glargine (LANTUS) 100 UNIT/ML injection, Inject 10 Units into the skin at bedtime. , Disp: , Rfl:  .  lisinopril (PRINIVIL,ZESTRIL) 20 MG tablet, Take 20 mg by mouth 2 (two) times daily., Disp: , Rfl:  .  TRUE METRIX BLOOD GLUCOSE TEST test strip, , Disp: , Rfl:  .  TRUEPLUS LANCETS 33G MISC, , Disp: , Rfl:  .  METOPROLOL TARTRATE PO, Take 100 mg by mouth daily. , Disp: , Rfl:   Physical exam:  Vitals:   11/05/17 1052 11/05/17 1055  BP:  129/80  Pulse:  70  Resp:  16  Temp:  (!) 96.1 F (35.6 C)  TempSrc:  Tympanic  Weight: 186 lb (84.4 kg)    Physical Exam  Constitutional: She is oriented to person, place, and time and well-developed, well-nourished, and in no distress.  HENT:  Head: Normocephalic and atraumatic.  Eyes: EOM are normal. Pupils are equal, round, and reactive to light.  Neck: Normal range of motion.  Cardiovascular: Normal rate, regular rhythm and normal heart sounds.  Pulmonary/Chest: Effort normal and breath sounds normal.  Abdominal: Soft. Bowel sounds are normal.  Neurological: She is alert and oriented to person, place, and time.  Skin: Skin is warm and dry.     CMP Latest Ref Rng & Units 11/05/2017  Glucose 65 - 99 mg/dL 139(H)  BUN 6 - 20 mg/dL 59(H)  Creatinine 0.44 - 1.00 mg/dL 1.79(H)  Sodium 135 - 145 mmol/L  134(L)  Potassium 3.5 - 5.1 mmol/L 4.6  Chloride 101 - 111 mmol/L 105  CO2 22 - 32 mmol/L 24  Calcium 8.9 - 10.3 mg/dL 9.0  Total Protein 6.5 - 8.1 g/dL 7.6  Total Bilirubin 0.3 - 1.2 mg/dL 0.5  Alkaline Phos 38 - 126 U/L 68  AST 15 - 41 U/L 16  ALT 14 - 54 U/L 12(L)   CBC Latest Ref Rng & Units 11/05/2017  WBC 3.6 - 11.0 K/uL 7.3  Hemoglobin 12.0 - 16.0 g/dL 9.4(L)  Hematocrit 35.0 - 47.0 % 28.2(L)  Platelets 150 - 440 K/uL 283     Assessment and plan- Patient is a 66 y.o. female with IgG MGUS  Kidney functions have come back to baseline. Hb stable between 9-10. If it is consistently <9, I will consider giving her procrit  Myeloma  labs from today are pending. Work up so far does not reveal evidence of overt multiple myeloma. I will see her back in 6 months. Labs- cbc, cmp, myeloma panel, free light chains a nd 24 hour urine studies a week prior.    Visit Diagnosis 1. MGUS (monoclonal gammopathy of unknown significance)      Dr. Randa Evens, MD, MPH [facility] Pager- [phone] 11/05/2017 12:39 PM

## 2017-11-07 DIAGNOSIS — D472 Monoclonal gammopathy: Secondary | ICD-10-CM | POA: Diagnosis not present

## 2017-11-08 ENCOUNTER — Other Ambulatory Visit: Payer: Self-pay

## 2017-11-08 DIAGNOSIS — D472 Monoclonal gammopathy: Secondary | ICD-10-CM

## 2017-11-08 LAB — KAPPA/LAMBDA LIGHT CHAINS
KAPPA FREE LGHT CHN: 63 mg/L — AB (ref 3.3–19.4)
Kappa, lambda light chain ratio: 0.57 (ref 0.26–1.65)
Lambda free light chains: 111.5 mg/L — ABNORMAL HIGH (ref 5.7–26.3)

## 2017-11-09 LAB — MULTIPLE MYELOMA PANEL, SERUM
ALPHA2 GLOB SERPL ELPH-MCNC: 0.8 g/dL (ref 0.4–1.0)
Albumin SerPl Elph-Mcnc: 3.4 g/dL (ref 2.9–4.4)
Albumin/Glob SerPl: 0.9 (ref 0.7–1.7)
Alpha 1: 0.2 g/dL (ref 0.0–0.4)
B-GLOBULIN SERPL ELPH-MCNC: 1 g/dL (ref 0.7–1.3)
GAMMA GLOB SERPL ELPH-MCNC: 1.9 g/dL — AB (ref 0.4–1.8)
GLOBULIN, TOTAL: 3.9 g/dL (ref 2.2–3.9)
IgA: 197 mg/dL (ref 87–352)
IgG (Immunoglobin G), Serum: 2037 mg/dL — ABNORMAL HIGH (ref 700–1600)
IgM (Immunoglobulin M), Srm: 107 mg/dL (ref 26–217)
M PROTEIN SERPL ELPH-MCNC: 1.2 g/dL — AB
Total Protein ELP: 7.3 g/dL (ref 6.0–8.5)

## 2017-11-10 LAB — UIFE/LIGHT CHAINS/TP QN, 24-HR UR
% BETA, Urine: 11.2 %
ALPHA 1 URINE: 1.4 %
Albumin, U: 72.1 %
Alpha 2, Urine: 3.9 %
FREE KAPPA/LAMBDA RATIO: 7.72 (ref 2.04–10.37)
Free Lambda Lt Chains,Ur: 7.44 mg/L — ABNORMAL HIGH (ref 0.24–6.66)
Free Lt Chn Excr Rate: 57.4 mg/L — ABNORMAL HIGH (ref 1.35–24.19)
GAMMA GLOBULIN URINE: 11.5 %
M-SPIKE %, Urine: 3.5 % — ABNORMAL HIGH
M-Spike, Mg/24 Hr: 9 mg/24 hr — ABNORMAL HIGH
TOTAL PROTEIN, URINE-UPE24: 21.4 mg/dL
Total Protein, Urine-Ur/day: 268 mg/24 hr — ABNORMAL HIGH (ref 30–150)
Total Volume: 1250

## 2017-11-23 ENCOUNTER — Ambulatory Visit
Admission: RE | Admit: 2017-11-23 | Discharge: 2017-11-23 | Disposition: A | Discharge status: Home / Self Care | Payer: Medicare HMO | Source: Ambulatory Visit | Attending: Internal Medicine | Admitting: Internal Medicine

## 2017-11-23 DIAGNOSIS — Z1231 Encounter for screening mammogram for malignant neoplasm of breast: Secondary | ICD-10-CM | POA: Insufficient documentation

## 2018-04-29 ENCOUNTER — Inpatient Hospital Stay: Payer: Medicare HMO | Attending: Oncology

## 2018-05-06 ENCOUNTER — Inpatient Hospital Stay: Payer: Medicare HMO | Admitting: Oncology

## 2018-10-05 IMAGING — CR DG BONE SURVEY MET
1 series · 8 of 8 positions shown · non-contrast
Comparison: None.

CLINICAL DATA: Monoclonal gammopathy of unknown significance

EXAM:
METASTATIC BONE SURVEY

[Series 1: dg bone survey met · 0.14mm/px · 8 of 22 slices shown]
[im 1/22]
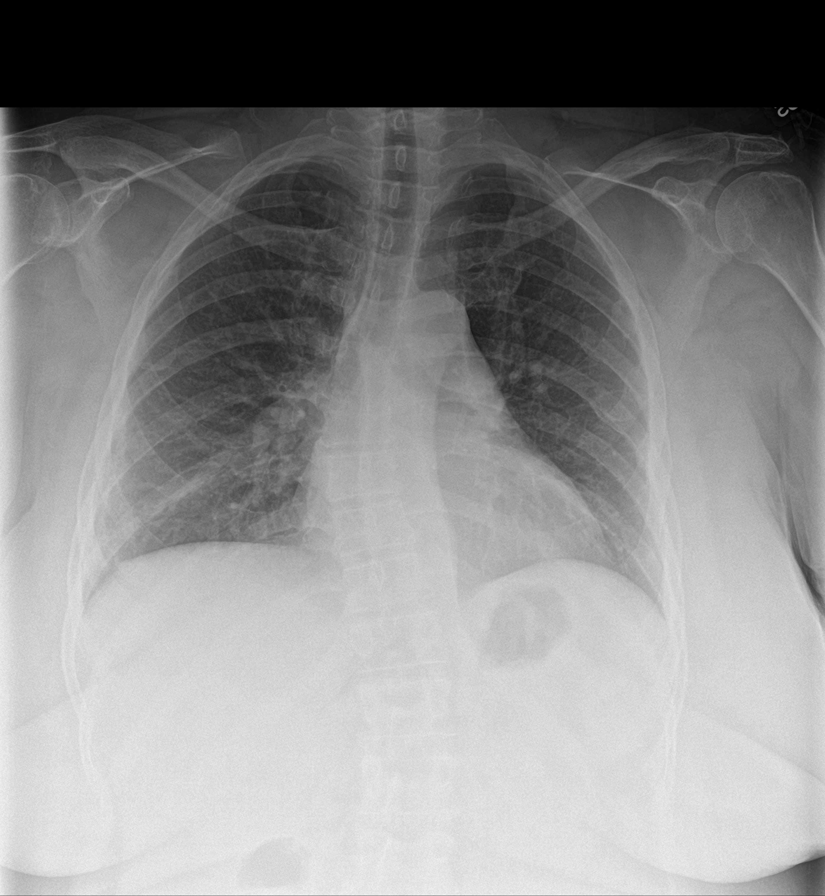
[im 4/22]
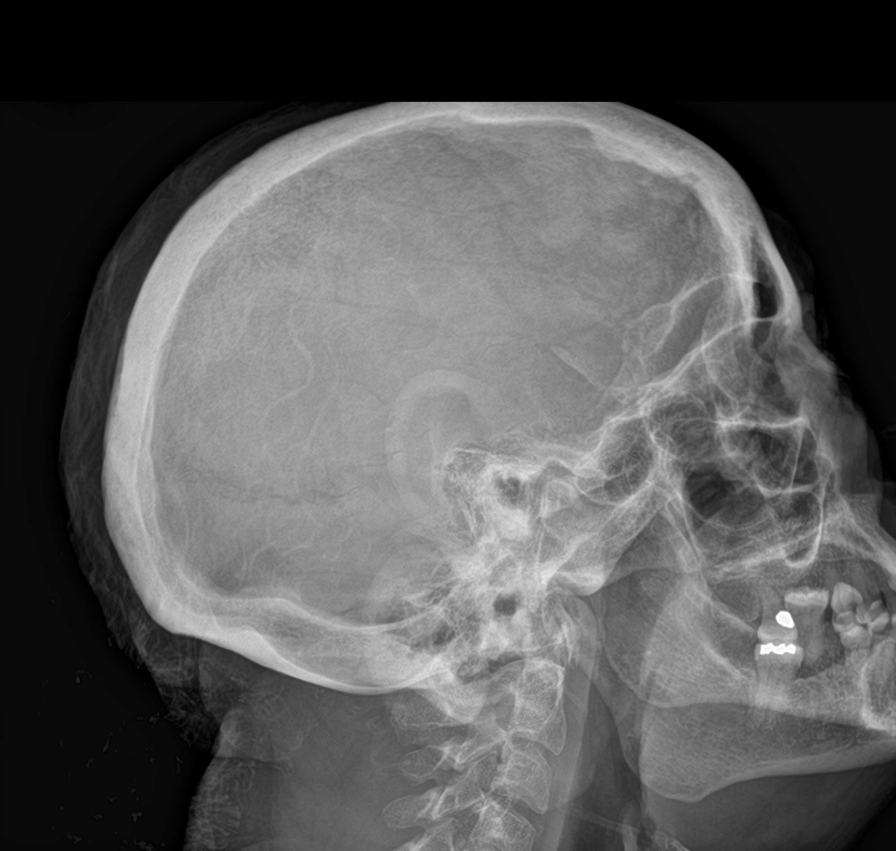
[im 7/22]
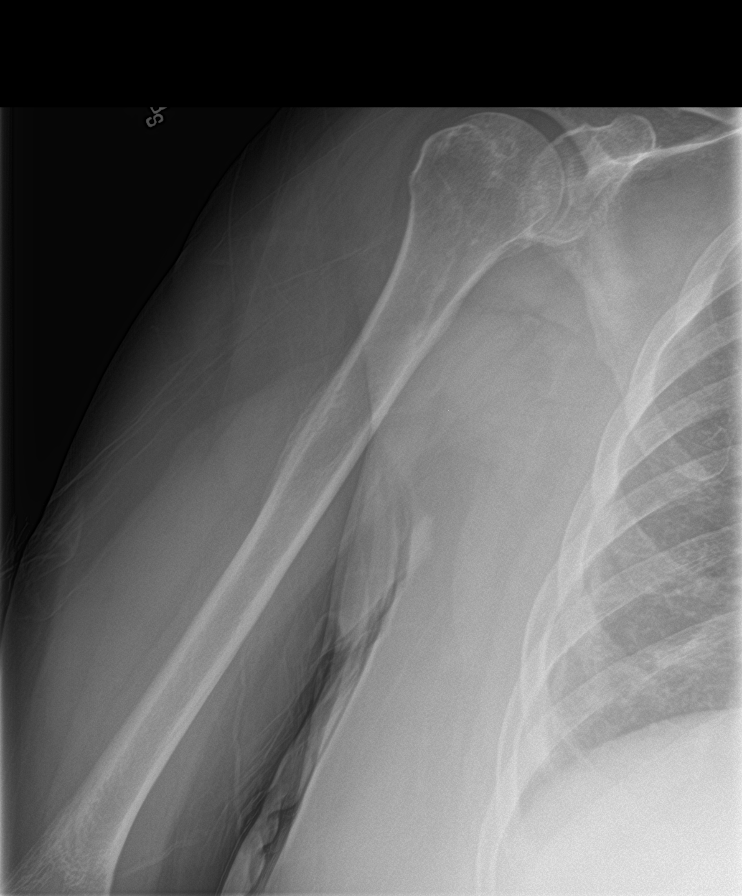
[im 10/22]
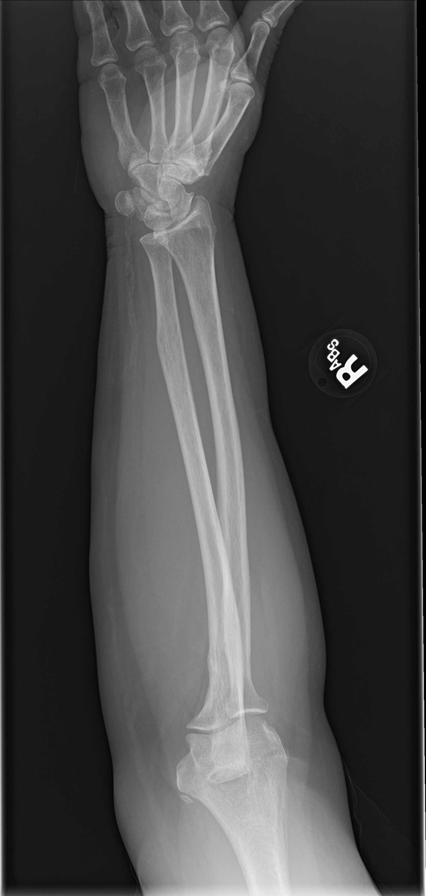
[im 13/22]
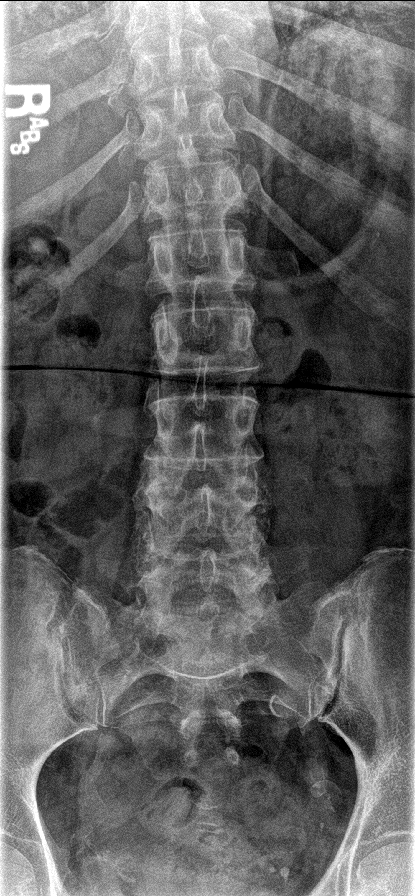
[im 16/22]
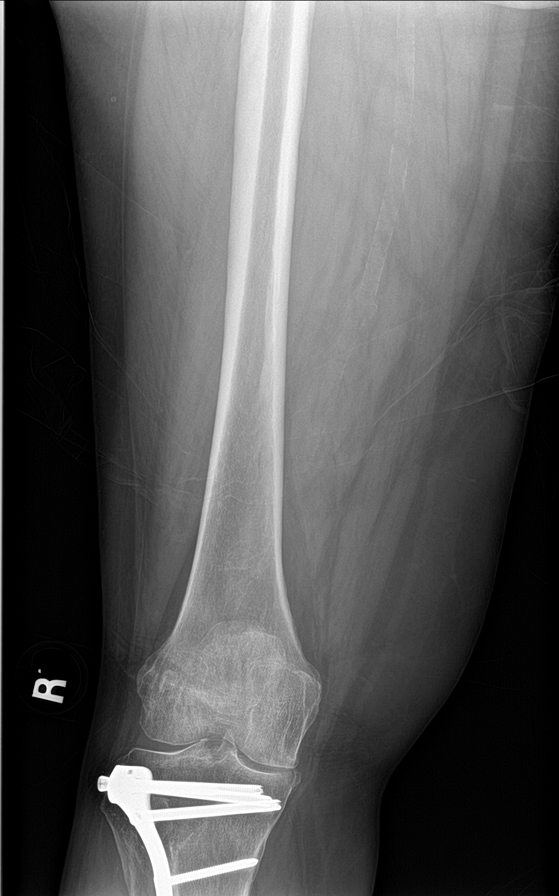
[im 19/22]
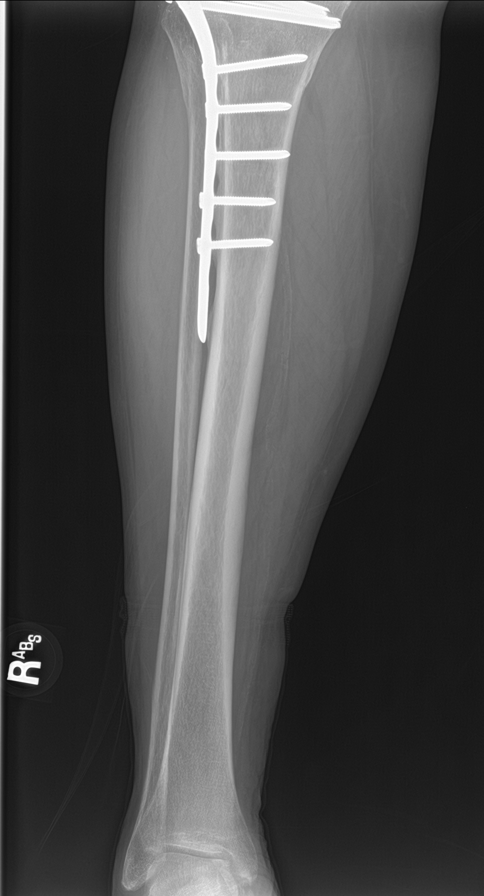
[im 22/22]
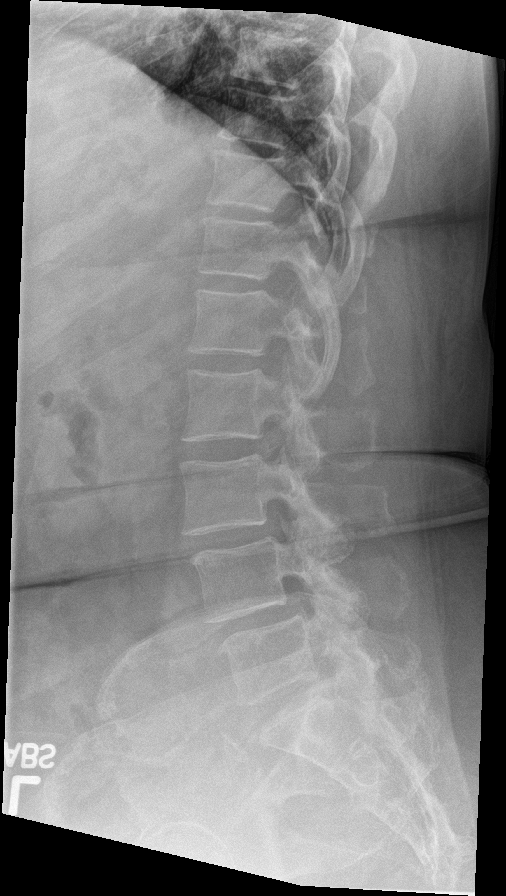

[8 of 8 positions shown; findings below may reference images not displayed]

FINDINGS: Frontal view of the chest shows no lytic or blastic lesions. Mild
mid and lower thoracic dextroscoliosis. Cervical spine shows no
lytic or blastic lesion. There is disc space flattening with
anterior spurring at C5-C6 and C6-C7 level. Lateral view of the
skull shows no lytic or blastic lesions. Bilateral humerus and
bilateral shoulder shows no lytic or blastic lesions. Bilateral
forearm there is no lytic or blastic lesion. No lytic or blastic
lesions are noted thoracic spine. No lytic or blastic lesions are
noted lumbar spine. There is mild anterolisthesis about 4 mm L4 on
L5 vertebral body. Mild facet degenerative changes noted L4 and L5
level. Frontal view of the pelvis shows no lytic or blastic lesions.
Bilateral femur shows no lytic or blastic lesion. Atherosclerotic
calcifications are noted bilateral femoral artery. Degenerative
changes are noted SI joints and pubic symphysis. Bilateral tibia and
fibula shows no lytic or blastic lesions. There is metallic fixation
plate and screws in proximal right tibia.
IMPRESSION: No lytic or blastic lesions are identified. There is dextroscoliosis
mid and lower thoracic spine. Degenerative changes are noted
cervical spine. Degenerative changes are noted lower lumbar spine.
Degenerative changes are noted bilateral SI joints and pubic
symphysis. Postsurgical changes with metallic fixation plate and
screws in proximal right tibia.

## 2019-08-22 ENCOUNTER — Emergency Department
Admission: EM | Admit: 2019-08-22 | Discharge: 2019-08-22 | Disposition: A | Discharge status: Home / Self Care | Payer: Medicare Other | Attending: Student | Admitting: Student

## 2019-08-22 ENCOUNTER — Encounter: Payer: Self-pay | Admitting: Emergency Medicine

## 2019-08-22 ENCOUNTER — Emergency Department: Payer: Medicare Other

## 2019-08-22 ENCOUNTER — Other Ambulatory Visit: Payer: Self-pay

## 2019-08-22 DIAGNOSIS — R1013 Epigastric pain: Secondary | ICD-10-CM | POA: Diagnosis not present

## 2019-08-22 DIAGNOSIS — Z91018 Allergy to other foods: Secondary | ICD-10-CM | POA: Diagnosis not present

## 2019-08-22 DIAGNOSIS — E119 Type 2 diabetes mellitus without complications: Secondary | ICD-10-CM | POA: Insufficient documentation

## 2019-08-22 DIAGNOSIS — I10 Essential (primary) hypertension: Secondary | ICD-10-CM | POA: Diagnosis not present

## 2019-08-22 DIAGNOSIS — Z87891 Personal history of nicotine dependence: Secondary | ICD-10-CM | POA: Insufficient documentation

## 2019-08-22 DIAGNOSIS — E782 Mixed hyperlipidemia: Secondary | ICD-10-CM | POA: Insufficient documentation

## 2019-08-22 DIAGNOSIS — R109 Unspecified abdominal pain: Secondary | ICD-10-CM

## 2019-08-22 DIAGNOSIS — R0789 Other chest pain: Secondary | ICD-10-CM | POA: Diagnosis present

## 2019-08-22 LAB — HEPATIC FUNCTION PANEL
ALT: 10 U/L (ref 0–44)
AST: 15 U/L (ref 15–41)
Albumin: 3.2 g/dL — ABNORMAL LOW (ref 3.5–5.0)
Alkaline Phosphatase: 69 U/L (ref 38–126)
Bilirubin, Direct: <0.1 mg/dL (ref 0.0–0.2)
Total Bilirubin: 0.7 mg/dL (ref 0.3–1.2)
Total Protein: 7.3 g/dL (ref 6.5–8.1)

## 2019-08-22 LAB — BASIC METABOLIC PANEL
Anion gap: 8 (ref 5–15)
BUN: 36 mg/dL — ABNORMAL HIGH (ref 8–23)
CO2: 23 mmol/L (ref 22–32)
Calcium: 8.7 mg/dL — ABNORMAL LOW (ref 8.9–10.3)
Chloride: 106 mmol/L (ref 98–111)
Creatinine, Ser: 1.67 mg/dL — ABNORMAL HIGH (ref 0.44–1.00)
GFR calc Af Amer: 37 mL/min — ABNORMAL LOW (ref >60)
GFR calc non Af Amer: 32 mL/min — ABNORMAL LOW (ref >60)
Glucose, Bld: 315 mg/dL — ABNORMAL HIGH (ref 70–99)
Potassium: 4 mmol/L (ref 3.5–5.1)
Sodium: 137 mmol/L (ref 135–145)

## 2019-08-22 LAB — CBC
HCT: 29.2 % — ABNORMAL LOW (ref 36.0–46.0)
Hemoglobin: 9.9 g/dL — ABNORMAL LOW (ref 12.0–15.0)
MCH: 29.9 pg (ref 26.0–34.0)
MCHC: 33.9 g/dL (ref 30.0–36.0)
MCV: 88.2 fL (ref 80.0–100.0)
Platelets: 256 10*3/uL (ref 150–400)
RBC: 3.31 MIL/uL — ABNORMAL LOW (ref 3.87–5.11)
RDW: 12 % (ref 11.5–15.5)
WBC: 7.5 10*3/uL (ref 4.0–10.5)
nRBC: 0 % (ref 0.0–0.2)

## 2019-08-22 LAB — GLUCOSE, CAPILLARY: Glucose-Capillary: 302 mg/dL — ABNORMAL HIGH (ref 70–99)

## 2019-08-22 LAB — TROPONIN I (HIGH SENSITIVITY)
Troponin I (High Sensitivity): 8 ng/L (ref <18)
Troponin I (High Sensitivity): 10 ng/L (ref <18)

## 2019-08-22 LAB — LIPASE, BLOOD: Lipase: 27 U/L (ref 11–51)

## 2019-08-22 MED ORDER — SODIUM CHLORIDE 0.9 % IV BOLUS
500.0000 mL | Freq: Once | INTRAVENOUS | Status: AC
  Administered 2019-08-22: 17:00:00 500 mL via INTRAVENOUS

## 2019-08-22 MED ORDER — SODIUM CHLORIDE 0.9% FLUSH: 3.0000 mL | Freq: Once | INTRAVENOUS | Status: DC

## 2019-08-22 MED ORDER — PANTOPRAZOLE SODIUM 40 MG IV SOLR
40.0000 mg | Freq: Once | INTRAVENOUS | Status: AC
  Administered 2019-08-22: 40 mg via INTRAVENOUS
  Filled 2019-08-22: qty 40

## 2019-08-22 NOTE — ED Notes (Signed)
EDP at bedside  

## 2019-08-22 NOTE — Discharge Instructions (Addendum)
Thank you for letting us take care of you in the emergency department today.   You can obtain some over the counter Prilosec or Pepcid to help with your stomach symptoms.   Please follow up with: - Your primary care doctor to review your ER visit and follow up on your symptoms. It is important you re-establish care for your overall health and to restart your medications.   Please return to the ER for any new or worsening symptoms.

## 2019-08-22 NOTE — ED Notes (Signed)
Called daughter and informed her of DC, stated she would come pick pt up.

## 2019-08-22 NOTE — ED Notes (Signed)
Jennifer Wood, daugher called for an update, [phone]

## 2019-08-22 NOTE — ED Provider Notes (Signed)
[facility] Emergency Department Provider Note  ____________________________________________   First MD Initiated Contact with Patient 08/22/19 1628     (approximate)  I have reviewed the triage vital signs and the nursing notes.  History  Chief Complaint Chest Pain    HPI Jennifer Wood is a 68 y.o. female with hx of HTN, DM, HLD who presents for upper abdominal (epigastric) abdominal pain x 2 months. Symptoms occur in episodes after eating. She describes it as a cramping like sensation. No nausea or vomiting. No upper chest pain, SOB, fevers. No diarrhea. Currently asymptomatic.   She states she has been off her diabetic and HTN medications x several years.          Past Medical Hx Past Medical History:  Diagnosis Date  . Depression   . Diabetes mellitus without complication ([place])   . Hypercholesteremia   . Hypertension   . Schizophrenia ([place])    pt denies this dx    Problem List Patient Active Problem List   Diagnosis Date Noted  . MGUS (monoclonal gammopathy of unknown significance) 11/05/2017  . Schizophrenia ([place]) 03/09/2017  . Hypertension 03/09/2017  . Diabetes ([place]) 03/09/2017  . Depression 03/09/2017    Past Surgical Hx Past Surgical History:  Procedure Laterality Date  . CATARACT EXTRACTION W/PHACO Left 03/11/2016   Procedure: CATARACT EXTRACTION PHACO AND INTRAOCULAR LENS PLACEMENT (IOC);  Surgeon: Leandrew Koyanagi, MD;  Location: [place];  Service: Ophthalmology;  Laterality: Left;  DIABETIC - insulin  . ORIF TIBIA PLATEAU Right 09/05/06   [facility]  . TUBAL LIGATION      Medications Prior to Admission medications   Medication Sig Start Date End Date Taking? Authorizing Provider  B-D UF III MINI PEN NEEDLES 31G X 5 MM MISC  12/25/16   [provider]  Cholecalciferol (VITAMIN D PO) Take by mouth.    [provider]  insulin aspart (NOVOLOG) 100 UNIT/ML injection Inject 14 Units into the skin  once.     [provider]  insulin glargine (LANTUS) 100 UNIT/ML injection Inject 10 Units into the skin at bedtime.     [provider]  lisinopril (PRINIVIL,ZESTRIL) 20 MG tablet Take 20 mg by mouth 2 (two) times daily.    [provider]  METOPROLOL TARTRATE PO Take 100 mg by mouth daily.     [provider]  TRUE METRIX BLOOD GLUCOSE TEST test strip  12/25/16   [provider]  TRUEPLUS LANCETS 33G [place]  12/25/16   [provider]    Allergies Tomato  Family Hx Family History  Problem Relation Age of Onset  . Breast cancer Neg Hx     Social Hx Social History   Tobacco Use  . Smoking status: Former Smoker    Packs/day: 0.25    Years: 5.00    Pack years: 1.25  . Smokeless tobacco: Current User    Types: Snuff  Substance Use Topics  . Alcohol use: No  . Drug use: No     Review of Systems  Constitutional: Negative for fever. Negative for chills. Eyes: Negative for visual changes. ENT: Negative for sore throat. Cardiovascular: Negative for chest pain. Respiratory: Negative for shortness of breath. Gastrointestinal: + for abdominal pain. Negative for nausea. Negative for vomiting. Genitourinary: Negative for dysuria. Musculoskeletal: Negative for leg swelling. Skin: Negative for rash. Neurological: Negative for for headaches.   Physical Exam  Vital Signs: ED Triage Vitals  Enc Vitals Group  BP 08/22/19 1407 (!) 159/122     Pulse Rate 08/22/19 1407 74     Resp 08/22/19 1407 16     Temp 08/22/19 1407 98.4 F (36.9 C)     Temp Source 08/22/19 1407 Oral     SpO2 08/22/19 1407 98 %     Weight 08/22/19 1405 170 lb (77.1 kg)     Height 08/22/19 1405 5\' 5"  (1.651 m)     Head Circumference --      Peak Flow --      Pain Score 08/22/19 1405 10     Pain Loc --      Pain Edu? --      Excl. in [place]? --     Constitutional: Alert and oriented.  Eyes: Conjunctivae clear. Sclera anicteric. Head: Normocephalic.  Atraumatic. Nose: No congestion. No rhinorrhea. Mouth/Throat: Mucous membranes are moist.  Neck: No stridor.   Cardiovascular: Normal rate, regular rhythm. No murmurs. Extremities well perfused. Respiratory: Normal respiratory effort.  Lungs CTAB. Gastrointestinal: Soft and non-tender throughout x 4 quardrants. No distention.  Musculoskeletal: No lower extremity edema. Neurologic:  Normal speech and language. No gross focal neurologic deficits are appreciated.  Skin: Skin is warm, dry and intact. No rash noted. Psychiatric: Mood and affect are appropriate for situation.  EKG  Personally reviewed.   Rate: 72 Rhythm: sinus Axis: leftward Intervals: WNL RBBB No acute changes compared to prior No acute ischemic changes   Radiology  CXR: IMPRESSION: No active cardiopulmonary disease.  RUQ [place]: IMPRESSION: Unremarkable right upper quadrant ultrasound.    Procedures  Procedure(s) performed (including critical care):  Procedures   Initial Impression / Assessment and Plan / ED Course  68 y.o. female with history of HTN, DM who presents to the ED for episodes of epigastric/upper abdominal cramping, after eating.  Hypertensive, otherwise VS unremarkable, and abdomen benign.   Ddx includes gastritis, pancreatitis, biliary colic, amongst others. Labs, [place], sx control, reassess.  Labs reveal normal hepatic function, normal lipase, and normal RUQ [place], less likely gallbladder etiology. Given dose of PPI. EKG w/o acute ischemic changes, and HS troponin <18. Mild hyperglycemia but no gap (no DKA or HHS). Remainder of labs near baseline. Given negative work up, will plan for discharge, advised OTC PPI/H2 blocker for symptom control. Also advised importance of re-establishing and follow up w/ PCP both with regards to her general medical care/medications as well as her ED visit. She voices understanding and states she will call for an appointment tomorrow. She has tolerated PO in the ED.  Will DC. Given return precautions.     Final Clinical Impression(s) / ED Diagnosis  Final diagnoses:  Abdominal pain  Epigastric abdominal pain       Note:  This document was prepared using Dragon voice recognition software and may include unintentional dictation errors.   Lilia Pro., MD 08/22/19 [phone]

## 2019-08-22 NOTE — ED Triage Notes (Signed)
Arrives with c/o chest pain / burning after eating x 2 months.  Per EMS report, patient's CBG elevated.  Patient stopped taking diabetes medication about 3 years ago.  States she used to take novolog and lantus.

## 2019-08-22 NOTE — ED Notes (Signed)
Pt states x 2 months each time she eats she gets pain in her chest. States sometimes it's a pressure and sometimes it burns. Denies taking reflux medication. Denies N&V. States hx of DM- noncompliant. Hasn't taken insulin in 3 years. Hasn't seen her doctor since pandemic began per pt. A&O, speaking in complete sentences. No distress noted.

## 2019-08-22 NOTE — ED Notes (Signed)
First nurse note: Pt here via EMS with c/o cp after she eats for 2 months now, htn 169/86, diabetes bg 362 for EMS, rBBB. Alert and oriented, NAD.

## 2019-09-20 ENCOUNTER — Other Ambulatory Visit: Payer: Self-pay | Admitting: Internal Medicine

## 2019-09-20 DIAGNOSIS — Z1231 Encounter for screening mammogram for malignant neoplasm of breast: Secondary | ICD-10-CM

## 2019-09-20 DIAGNOSIS — Z13820 Encounter for screening for osteoporosis: Secondary | ICD-10-CM

## 2019-10-17 ENCOUNTER — Encounter: Payer: Self-pay | Admitting: *Deleted

## 2019-11-01 ENCOUNTER — Telehealth: Payer: Self-pay

## 2019-11-01 NOTE — Telephone Encounter (Signed)
Patient LVM on my voicemail.  Returned her call, unable to contact her though.  "Call can not be completed at this time".  Thanks [employer]

## 2020-04-11 ENCOUNTER — Emergency Department: Payer: Medicare Other

## 2020-04-11 ENCOUNTER — Encounter: Payer: Self-pay | Admitting: *Deleted

## 2020-04-11 ENCOUNTER — Emergency Department
Admission: EM | Admit: 2020-04-11 | Discharge: 2020-04-12 | Disposition: A | Discharge status: Home / Self Care | Payer: Medicare Other | Attending: Emergency Medicine | Admitting: Emergency Medicine

## 2020-04-11 ENCOUNTER — Other Ambulatory Visit: Payer: Self-pay

## 2020-04-11 DIAGNOSIS — Z794 Long term (current) use of insulin: Secondary | ICD-10-CM | POA: Diagnosis not present

## 2020-04-11 DIAGNOSIS — M109 Gout, unspecified: Secondary | ICD-10-CM | POA: Diagnosis not present

## 2020-04-11 DIAGNOSIS — M79671 Pain in right foot: Secondary | ICD-10-CM | POA: Diagnosis present

## 2020-04-11 DIAGNOSIS — E119 Type 2 diabetes mellitus without complications: Secondary | ICD-10-CM | POA: Diagnosis not present

## 2020-04-11 DIAGNOSIS — Z87891 Personal history of nicotine dependence: Secondary | ICD-10-CM | POA: Diagnosis not present

## 2020-04-11 DIAGNOSIS — Z79899 Other long term (current) drug therapy: Secondary | ICD-10-CM | POA: Insufficient documentation

## 2020-04-11 DIAGNOSIS — I10 Essential (primary) hypertension: Secondary | ICD-10-CM | POA: Insufficient documentation

## 2020-04-11 LAB — BASIC METABOLIC PANEL
Anion gap: 8 (ref 5–15)
BUN: 60 mg/dL — ABNORMAL HIGH (ref 8–23)
CO2: 23 mmol/L (ref 22–32)
Calcium: 8.4 mg/dL — ABNORMAL LOW (ref 8.9–10.3)
Chloride: 102 mmol/L (ref 98–111)
Creatinine, Ser: 2.24 mg/dL — ABNORMAL HIGH (ref 0.44–1.00)
GFR calc Af Amer: 26 mL/min — ABNORMAL LOW (ref >60)
GFR calc non Af Amer: 22 mL/min — ABNORMAL LOW (ref >60)
Glucose, Bld: 216 mg/dL — ABNORMAL HIGH (ref 70–99)
Potassium: 3.7 mmol/L (ref 3.5–5.1)
Sodium: 133 mmol/L — ABNORMAL LOW (ref 135–145)

## 2020-04-11 LAB — CBC
HCT: 25.9 % — ABNORMAL LOW (ref 36.0–46.0)
Hemoglobin: 8.6 g/dL — ABNORMAL LOW (ref 12.0–15.0)
MCH: 30.1 pg (ref 26.0–34.0)
MCHC: 33.2 g/dL (ref 30.0–36.0)
MCV: 90.6 fL (ref 80.0–100.0)
Platelets: 243 10*3/uL (ref 150–400)
RBC: 2.86 MIL/uL — ABNORMAL LOW (ref 3.87–5.11)
RDW: 12.4 % (ref 11.5–15.5)
WBC: 11.9 10*3/uL — ABNORMAL HIGH (ref 4.0–10.5)
nRBC: 0 % (ref 0.0–0.2)

## 2020-04-11 LAB — URIC ACID: Uric Acid, Serum: 10.8 mg/dL — ABNORMAL HIGH (ref 2.5–7.1)

## 2020-04-11 NOTE — ED Triage Notes (Signed)
Pt has right foot pain and swelling for  2 days.  Diabetic.  No known injury.  Painful to ambulate.  Pt alert  Speech clear.

## 2020-04-12 DIAGNOSIS — M109 Gout, unspecified: Secondary | ICD-10-CM | POA: Diagnosis not present

## 2020-04-12 MED ORDER — PREDNISONE 10 MG PO TABS: ORAL_TABLET | ORAL | 0 refills | Status: DC

## 2020-04-12 MED ORDER — PREDNISONE 20 MG PO TABS
40.0000 mg | ORAL_TABLET | ORAL | Status: AC
  Administered 2020-04-12: 40 mg via ORAL
  Filled 2020-04-12: qty 2

## 2020-04-12 MED ORDER — OXYCODONE-ACETAMINOPHEN 5-325 MG PO TABS
1.0000 | ORAL_TABLET | Freq: Once | ORAL | Status: AC
  Administered 2020-04-12: 1 via ORAL
  Filled 2020-04-12: qty 1

## 2020-04-12 MED ORDER — HYDROCODONE-ACETAMINOPHEN 5-325 MG PO TABS: 2.0000 | ORAL_TABLET | Freq: Four times a day (QID) | ORAL | 0 refills | Status: DC | PRN

## 2020-04-12 MED ORDER — ACETAMINOPHEN 325 MG PO TABS
650.0000 mg | ORAL_TABLET | Freq: Once | ORAL | Status: AC
  Administered 2020-04-12: 650 mg via ORAL
  Filled 2020-04-12: qty 2

## 2020-04-12 NOTE — ED Provider Notes (Signed)
[facility] Emergency Department Provider Note  ____________________________________________   First MD Initiated Contact with Patient 04/11/20 2301     (approximate)  I have reviewed the triage vital signs and the nursing notes.   HISTORY  Chief Complaint Foot Pain    HPI Jennifer Wood is a 69 y.o. female with medical history as listed below which also, based on her labs, includes chronic renal insufficiency.  She presents for about 2 days of pain and swelling in her right foot, specifically the base of the big toe.  The pain and the swelling seems to spread out from that location.  She had no known injury.  No history of gout.  No lacerations or punctures.  She is diabetic but she is not currently taking medication and says she needs a new doctor.  She has been eating and drinking normally.  She takes no diuretics.  She denies fever/chills, sore throat, chest pain, shortness of breath, nausea, vomiting, and abdominal pain.        Past Medical History:  Diagnosis Date  . Depression   . Diabetes mellitus without complication ([place])   . Hypercholesteremia   . Hypertension   . Schizophrenia [facility])    pt denies this dx    Patient Active Problem List   Diagnosis Date Noted  . MGUS (monoclonal gammopathy of unknown significance) 11/05/2017  . Schizophrenia ([place]) 03/09/2017  . Hypertension 03/09/2017  . Diabetes ([place]) 03/09/2017  . Depression 03/09/2017    Past Surgical History:  Procedure Laterality Date  . CATARACT EXTRACTION W/PHACO Left 03/11/2016   Procedure: CATARACT EXTRACTION PHACO AND INTRAOCULAR LENS PLACEMENT (IOC);  Surgeon: Leandrew Koyanagi, MD;  Location: [place];  Service: Ophthalmology;  Laterality: Left;  DIABETIC - insulin  . ORIF TIBIA PLATEAU Right 09/05/06   [facility]  . TUBAL LIGATION      Prior to Admission medications   Medication Sig Start Date End Date Taking? Authorizing Provider  B-D UF III MINI PEN NEEDLES 31G  X 5 MM MISC  12/25/16   [provider]  Cholecalciferol (VITAMIN D PO) Take by mouth.    [provider]  HYDROcodone-acetaminophen (NORCO/VICODIN) 5-325 MG tablet Take 2 tablets by mouth every 6 (six) hours as needed for moderate pain or severe pain. 04/12/20   Hinda Kehr, MD  insulin aspart (NOVOLOG) 100 UNIT/ML injection Inject 14 Units into the skin once.     [provider]  insulin glargine (LANTUS) 100 UNIT/ML injection Inject 10 Units into the skin at bedtime.     [provider]  lisinopril (PRINIVIL,ZESTRIL) 20 MG tablet Take 20 mg by mouth 2 (two) times daily.    [provider]  METOPROLOL TARTRATE PO Take 100 mg by mouth daily.     [provider]  predniSONE (DELTASONE) 10 MG tablet Take 4 tabs (40 mg) PO x 3 days, then take 2 tabs (20 mg) PO x 3 days, then take 1 tab (10 mg) PO x 3 days, then take 1/2 tab (5 mg) PO x 4 days. 04/12/20   Hinda Kehr, MD  TRUE METRIX BLOOD GLUCOSE TEST test strip  12/25/16   [provider]  TRUEPLUS LANCETS 33G [place]  12/25/16   [provider]    Allergies Tomato  Family History  Problem Relation Age of Onset  . Breast cancer Neg Hx     Social History Social History   Tobacco Use  . Smoking status: Former Smoker  Packs/day: 0.25    Years: 5.00    Pack years: 1.25  . Smokeless tobacco: Current User    Types: Snuff  Substance Use Topics  . Alcohol use: No  . Drug use: No    Review of Systems Constitutional: No fever/chills Eyes: No visual changes. ENT: No sore throat. Cardiovascular: Denies chest pain. Respiratory: Denies shortness of breath. Gastrointestinal: No abdominal pain.  No nausea, no vomiting.  No diarrhea.  No constipation. Genitourinary: Negative for dysuria. Musculoskeletal: Pain and swelling in the right foot, specifically at the base of the big toe. Integumentary: Negative for rash. Neurological: Negative for headaches, focal weakness  or numbness.   ____________________________________________   PHYSICAL EXAM:  VITAL SIGNS: ED Triage Vitals  Enc Vitals Group     BP 04/11/20 2037 (!) 155/50     Pulse Rate 04/11/20 2037 84     Resp 04/11/20 2037 20     Temp 04/11/20 2037 98.7 F (37.1 C)     Temp Source 04/11/20 2037 Oral     SpO2 04/11/20 2037 96 %     Weight 04/11/20 2040 77.1 kg (170 lb)     Height 04/11/20 2040 1.651 m (5\' 5" )     Head Circumference --      Peak Flow --      Pain Score 04/11/20 2040 10     Pain Loc --      Pain Edu? --      Excl. in [place]? --     Constitutional: Alert and oriented.  Eyes: Conjunctivae are normal.  Head: Atraumatic. Nose: No congestion/rhinnorhea. Mouth/Throat: Patient is wearing a mask. Neck: No stridor.  No meningeal signs.   Cardiovascular: Normal rate, regular rhythm. Good peripheral circulation. Grossly normal heart sounds. Respiratory: Normal respiratory effort.  No retractions. Gastrointestinal: Soft and nontender. No distention.  Musculoskeletal: The patient has edema and some erythema over the first MP joint on the right foot consistent with podagra.  There is also tenderness to palpation and swelling of the top of the foot.  There are no disruptions of the skin and no evidence of foot ulcers, lacerations, nor abrasions.  The tenderness does not extend beyond the top of the foot to the ankle.  No other acute abnormalities are noted. Neurologic:  Normal speech and language. No gross focal neurologic deficits are appreciated.  Skin:  Skin is warm, dry and intact. Psychiatric: Mood and affect are normal. Speech and behavior are normal.  ____________________________________________   LABS (all labs ordered are listed, but only abnormal results are displayed)  Labs Reviewed  BASIC METABOLIC PANEL - Abnormal; Notable for the following components:      Result Value   Sodium 133 (*)    Glucose, Bld 216 (*)    BUN 60 (*)    Creatinine, Ser 2.24 (*)    Calcium  8.4 (*)    GFR calc non Af Amer 22 (*)    GFR calc Af Amer 26 (*)    All other components within normal limits  CBC - Abnormal; Notable for the following components:   WBC 11.9 (*)    RBC 2.86 (*)    Hemoglobin 8.6 (*)    HCT 25.9 (*)    All other components within normal limits  URIC ACID - Abnormal; Notable for the following components:   Uric Acid, Serum 10.8 (*)    All other components within normal limits   ____________________________________________  EKG  No indication for EKG ____________________________________________  RADIOLOGY I,  Hinda Kehr, personally viewed and evaluated these images (plain radiographs) as part of my medical decision making, as well as reviewing the written report by the radiologist.  ED MD interpretation: Unremarkable radiograph with no evidence of bony abnormality.  Official radiology report(s): DG Foot Complete Right  Result Date: 04/11/2020 CLINICAL DATA:  Right foot pain and swelling EXAM: RIGHT FOOT COMPLETE - 3+ VIEW COMPARISON:  None. FINDINGS: There is no evidence of fracture or dislocation. There is no evidence of arthropathy or other focal bone abnormality. Soft tissues are unremarkable. IMPRESSION: Negative. Electronically Signed   By: Ulyses Jarred M.D.   On: 04/11/2020 23:40    ____________________________________________   PROCEDURES   Procedure(s) performed (including Critical Care):  Procedures   ____________________________________________   INITIAL IMPRESSION / MDM / [place] / ED COURSE  As part of my medical decision making, I reviewed the following data within the [place] notes reviewed and incorporated, Labs reviewed , Old chart reviewed, Radiograph reviewed , Notes from prior ED visits and [place] Controlled Substance Database   Differential diagnosis includes, but is not limited to, gout or pseudogout, cellulitis, fracture or dislocation, other soft tissue infection.  The  presentation is very consistent with gout.  Her uric acid is elevated at 10.8.  She has baseline renal dysfunction and her kidney function is no worse than it has been in the past, slightly elevated creatinine over the most recent levels but not substantially so.  Her BUN is elevated but again it is in range with which she has had in the past.  She does not appear clinically dehydrated.  Radiographs are unremarkable.  Given the patient's baseline renal dysfunction, she is not a good candidate for NSAIDs including indomethacin, and she has not a good candidate for colchicine.  As result I will prescribe a prednisone taper and opioids and I strongly encouraged her to call the podiatry office in the morning for additional evaluation and work-up.  She will be given crutches if she would like them given the pain with ambulation.  I gave my usual and customary return precautions.  There is no role for antibiotics at this time.   ____________________________________________  FINAL CLINICAL IMPRESSION(S) / ED DIAGNOSES  Final diagnoses:  Podagra     MEDICATIONS GIVEN DURING THIS VISIT:  Medications  predniSONE (DELTASONE) tablet 40 mg (has no administration in time range)  oxyCODONE-acetaminophen (PERCOCET/ROXICET) 5-325 MG per tablet 1 tablet (has no administration in time range)  acetaminophen (TYLENOL) tablet 650 mg (has no administration in time range)     ED Discharge Orders         Ordered    predniSONE (DELTASONE) 10 MG tablet     04/12/20 0011    HYDROcodone-acetaminophen (NORCO/VICODIN) 5-325 MG tablet  Every 6 hours PRN     04/12/20 0011          *Please note:  HAILE TOPPINS was evaluated in Emergency Department on 04/12/2020 for the symptoms described in the history of present illness. She was evaluated in the context of the global COVID-19 pandemic, which necessitated consideration that the patient might be at risk for infection with the SARS-CoV-2 virus that causes COVID-19.  Institutional protocols and algorithms that pertain to the evaluation of patients at risk for COVID-19 are in a state of rapid change based on information released by regulatory bodies including the CDC and federal and state organizations. These policies and algorithms were followed during the patient's care in the  ED.  Some ED evaluations and interventions may be delayed as a result of limited staffing during the pandemic.*  Note:  This document was prepared using Dragon voice recognition software and may include unintentional dictation errors.   Hinda Kehr, MD 04/12/20 [phone]

## 2020-04-12 NOTE — Discharge Instructions (Addendum)
We believe your pain and swelling is due to a condition called gout.  When the gout occurs in your foot at the base of your big toe, it is called podagra.  Please read through the included information.  Take the prescribed prednisone for the full course of treatment.  You can take over-the-counter Tylenol as needed for pain control, and take Norco as prescribed for severe pain. Do not drink alcohol, drive or participate in any other potentially dangerous activities while taking this medication as it may make you sleepy. Do not take this medication with any other sedating medications, either prescription or over-the-counter. If you were prescribed Percocet or Vicodin, do not take these with acetaminophen (Tylenol) as it is already contained within these medications.   This medication is an opiate (or narcotic) pain medication and can be habit forming.  Use it as little as possible to achieve adequate pain control.  Do not use or use it with extreme caution if you have a history of opiate abuse or dependence.  If you are on a pain contract with your primary care doctor or a pain specialist, be sure to let them know you were prescribed this medication today from the [facility] Emergency Department.  This medication is intended for your use only - do not give any to anyone else and keep it in a secure place where nobody else, especially children, have access to it.  It will also cause or worsen constipation, so you may want to consider taking an over-the-counter stool softener while you are taking this medication.

## 2020-06-12 ENCOUNTER — Other Ambulatory Visit: Payer: Self-pay | Admitting: Internal Medicine

## 2020-06-12 DIAGNOSIS — Q670 Congenital facial asymmetry: Secondary | ICD-10-CM

## 2020-07-03 ENCOUNTER — Ambulatory Visit
Admission: RE | Admit: 2020-07-03 | Discharge: 2020-07-03 | Disposition: A | Discharge status: Home / Self Care | Payer: Medicare Other | Source: Ambulatory Visit | Attending: Internal Medicine | Admitting: Internal Medicine

## 2020-07-03 ENCOUNTER — Other Ambulatory Visit: Payer: Self-pay

## 2020-07-03 DIAGNOSIS — Q670 Congenital facial asymmetry: Secondary | ICD-10-CM | POA: Diagnosis present

## 2020-07-03 MED ORDER — GADOBUTROL 1 MMOL/ML IV SOLN
7.0000 mL | Freq: Once | INTRAVENOUS | Status: AC | PRN
  Administered 2020-07-03: 7 mL via INTRAVENOUS

## 2020-08-05 DIAGNOSIS — Z8673 Personal history of transient ischemic attack (TIA), and cerebral infarction without residual deficits: Secondary | ICD-10-CM | POA: Insufficient documentation

## 2020-10-05 ENCOUNTER — Other Ambulatory Visit: Payer: Self-pay

## 2020-10-05 ENCOUNTER — Encounter: Payer: Self-pay | Admitting: Emergency Medicine

## 2020-10-05 ENCOUNTER — Emergency Department
Admission: EM | Admit: 2020-10-05 | Discharge: 2020-10-05 | Disposition: A | Discharge status: Home / Self Care | Payer: Medicare Other | Attending: Emergency Medicine | Admitting: Emergency Medicine

## 2020-10-05 ENCOUNTER — Emergency Department: Payer: Medicare Other

## 2020-10-05 DIAGNOSIS — E119 Type 2 diabetes mellitus without complications: Secondary | ICD-10-CM | POA: Diagnosis not present

## 2020-10-05 DIAGNOSIS — Z87891 Personal history of nicotine dependence: Secondary | ICD-10-CM | POA: Insufficient documentation

## 2020-10-05 DIAGNOSIS — I10 Essential (primary) hypertension: Secondary | ICD-10-CM | POA: Insufficient documentation

## 2020-10-05 DIAGNOSIS — Z794 Long term (current) use of insulin: Secondary | ICD-10-CM | POA: Diagnosis not present

## 2020-10-05 DIAGNOSIS — R0789 Other chest pain: Secondary | ICD-10-CM | POA: Insufficient documentation

## 2020-10-05 DIAGNOSIS — Z79899 Other long term (current) drug therapy: Secondary | ICD-10-CM | POA: Insufficient documentation

## 2020-10-05 LAB — BASIC METABOLIC PANEL
Anion gap: 6 (ref 5–15)
BUN: 45 mg/dL — ABNORMAL HIGH (ref 8–23)
CO2: 24 mmol/L (ref 22–32)
Calcium: 8.7 mg/dL — ABNORMAL LOW (ref 8.9–10.3)
Chloride: 108 mmol/L (ref 98–111)
Creatinine, Ser: 1.82 mg/dL — ABNORMAL HIGH (ref 0.44–1.00)
GFR, Estimated: 28 mL/min — ABNORMAL LOW (ref >60)
Glucose, Bld: 134 mg/dL — ABNORMAL HIGH (ref 70–99)
Potassium: 4 mmol/L (ref 3.5–5.1)
Sodium: 138 mmol/L (ref 135–145)

## 2020-10-05 LAB — CBC
HCT: 28.3 % — ABNORMAL LOW (ref 36.0–46.0)
Hemoglobin: 9.6 g/dL — ABNORMAL LOW (ref 12.0–15.0)
MCH: 30.9 pg (ref 26.0–34.0)
MCHC: 33.9 g/dL (ref 30.0–36.0)
MCV: 91 fL (ref 80.0–100.0)
Platelets: 251 10*3/uL (ref 150–400)
RBC: 3.11 MIL/uL — ABNORMAL LOW (ref 3.87–5.11)
RDW: 12.5 % (ref 11.5–15.5)
WBC: 6.5 10*3/uL (ref 4.0–10.5)
nRBC: 0 % (ref 0.0–0.2)

## 2020-10-05 LAB — TROPONIN I (HIGH SENSITIVITY): Troponin I (High Sensitivity): 5 ng/L (ref <18)

## 2020-10-05 MED ORDER — PANTOPRAZOLE SODIUM 20 MG PO TBEC: 20.0000 mg | DELAYED_RELEASE_TABLET | Freq: Every day | ORAL | 1 refills | Status: DC

## 2020-10-05 NOTE — ED Triage Notes (Signed)
Pt to ED c/o intermittent chest pain x 3-4 months. Pt states that the pain goes "up and down". Pt recently on steriods for gout and feels like this made the pain worse. Pt states that the pain feels like acid reflux but that she also feels like she is going to have a heart attack. Pt is in NAD.

## 2020-10-05 NOTE — ED Provider Notes (Signed)
[facility] Emergency Department Provider Note   ____________________________________________    I have reviewed the triage vital signs and the nursing notes.   HISTORY  Chief Complaint Chest Pain     HPI Jennifer Wood is a 69 y.o. female with a history of diabetes, schizophrenia, hypertension who reports intermittent chest discomfort over the last 3 months.  She blames it on taking prednisone for gout, after she took prednisone she reported she had burning and discomfort in her chest and epigastrium.  Has not taken anything for heartburn.  Currently feels well and is asymptomatic today.  No shortness of breath.  No pleurisy.  No nausea vomiting or diaphoresis.  Has not take anything for this.  Past Medical History:  Diagnosis Date  . Depression   . Diabetes mellitus without complication ([place] [employer])   . Hypercholesteremia   . Hypertension   . Schizophrenia [facility])    pt denies this dx    Patient Active Problem List   Diagnosis Date Noted  . MGUS (monoclonal gammopathy of unknown significance) 11/05/2017  . Schizophrenia ([place]) 03/09/2017  . Hypertension 03/09/2017  . Diabetes ([place]) 03/09/2017  . Depression 03/09/2017    Past Surgical History:  Procedure Laterality Date  . CATARACT EXTRACTION W/PHACO Left 03/11/2016   Procedure: CATARACT EXTRACTION PHACO AND INTRAOCULAR LENS PLACEMENT (IOC);  Surgeon: Leandrew Koyanagi, MD;  Location: [place];  Service: Ophthalmology;  Laterality: Left;  DIABETIC - insulin  . ORIF TIBIA PLATEAU Right 09/05/06   [facility]  . TUBAL LIGATION      Prior to Admission medications   Medication Sig Start Date End Date Taking? Authorizing Provider  B-D UF III MINI PEN NEEDLES 31G X 5 MM MISC  12/25/16   [provider]  Cholecalciferol (VITAMIN D PO) Take by mouth.    [provider]  HYDROcodone-acetaminophen (NORCO/VICODIN) 5-325 MG tablet Take 2 tablets by mouth every 6 (six) hours as needed for  moderate pain or severe pain. 04/12/20   Hinda Kehr, MD  insulin aspart (NOVOLOG) 100 UNIT/ML injection Inject 14 Units into the skin once.     [provider]  insulin glargine (LANTUS) 100 UNIT/ML injection Inject 10 Units into the skin at bedtime.     [provider]  lisinopril (PRINIVIL,ZESTRIL) 20 MG tablet Take 20 mg by mouth 2 (two) times daily.    [provider]  METOPROLOL TARTRATE PO Take 100 mg by mouth daily.     [provider]  pantoprazole (PROTONIX) 20 MG tablet Take 1 tablet (20 mg total) by mouth daily. 10/05/20 10/05/21  Lavonia Drafts, MD  predniSONE (DELTASONE) 10 MG tablet Take 4 tabs (40 mg) PO x 3 days, then take 2 tabs (20 mg) PO x 3 days, then take 1 tab (10 mg) PO x 3 days, then take 1/2 tab (5 mg) PO x 4 days. 04/12/20   Hinda Kehr, MD  TRUE METRIX BLOOD GLUCOSE TEST test strip  12/25/16   [provider]  TRUEPLUS LANCETS 33G [place]  12/25/16   [provider]     Allergies Tomato  Family History  Problem Relation Age of Onset  . Breast cancer Neg Hx     Social History Social History   Tobacco Use  . Smoking status: Former Smoker    Packs/day: 0.25    Years: 5.00    Pack years: 1.25  . Smokeless tobacco: Current User    Types: Snuff  Substance Use Topics  .  Alcohol use: No  . Drug use: No    Review of Systems  Constitutional: No fever/chills Eyes: No visual changes.  ENT: No sore throat. Cardiovascular: As above Respiratory: Denies shortness of breath. Gastrointestinal: As above Genitourinary: Negative for dysuria. Musculoskeletal: Negative for back pain. Skin: Negative for rash. Neurological: Negative for headaches    ____________________________________________   PHYSICAL EXAM:  VITAL SIGNS: ED Triage Vitals  Enc Vitals Group     BP 10/05/20 0812 (!) 156/70     Pulse Rate 10/05/20 0812 68     Resp 10/05/20 0812 18     Temp 10/05/20 0819 98.5 F (36.9 C)     Temp Source  10/05/20 0812 Oral     SpO2 10/05/20 0812 98 %     Weight 10/05/20 0816 77.1 kg (170 lb)     Height 10/05/20 0816 1.676 m (5\' 6" )     Head Circumference --      Peak Flow --      Pain Score 10/05/20 0816 5     Pain Loc --      Pain Edu? --      Excl. in [place]? --     Constitutional: Alert and oriented.   Nose: No congestion/rhinnorhea. Mouth/Throat: Mucous membranes are moist.    Cardiovascular: Normal rate, regular rhythm. Grossly normal heart sounds.  Good peripheral circulation. Respiratory: Normal respiratory effort.  No retractions. Lungs CTAB. Gastrointestinal: Soft and nontender. No distention.    Musculoskeletal: No lower extremity tenderness nor edema.  Warm and well perfused Neurologic:  Normal speech and language. No gross focal neurologic deficits are appreciated.  Skin:  Skin is warm, dry and intact. No rash noted. Psychiatric: Mood and affect are normal. Speech and behavior are normal.  ____________________________________________   LABS (all labs ordered are listed, but only abnormal results are displayed)  Labs Reviewed  BASIC METABOLIC PANEL - Abnormal; Notable for the following components:      Result Value   Glucose, Bld 134 (*)    BUN 45 (*)    Creatinine, Ser 1.82 (*)    Calcium 8.7 (*)    GFR, Estimated 28 (*)    All other components within normal limits  CBC - Abnormal; Notable for the following components:   RBC 3.11 (*)    Hemoglobin 9.6 (*)    HCT 28.3 (*)    All other components within normal limits  TROPONIN I (HIGH SENSITIVITY)   ____________________________________________  EKG  ED ECG REPORT I, Lavonia Drafts, the attending physician, personally viewed and interpreted this ECG.  Date: 10/05/2020  Rhythm: normal sinus rhythm QRS Axis: normal Intervals: normal ST/T Wave abnormalities: normal Narrative Interpretation: no evidence of acute ischemia  ____________________________________________  RADIOLOGY  Chest x-ray reviewed by  me, no infiltrate effusion or pneumothorax ____________________________________________   PROCEDURES  Procedure(s) performed: No  Procedures   Critical Care performed: No ____________________________________________   INITIAL IMPRESSION / ASSESSMENT AND PLAN / ED COURSE  Pertinent labs & imaging results that were available during my care of the patient were reviewed by me and considered in my medical decision making (see chart for details).  Patient presents with intermittent chest discomfort over several months, asymptomatic today.  Differential includes GERD/acid reflux, angina, not consistent with PE or ACS  EKG today is reassuring.  Lab work is unremarkable, normal white blood cell count, troponin is unremarkable.  She does have a history of chronic kidney disease, creatinine is improved from prior.  Chest x-ray without effusion nor  pneumothorax or pneumonia.  We will start the patient on Protonix for possible GERD, have her follow-up with cardiology for further extensive work-up    ____________________________________________   FINAL CLINICAL IMPRESSION(S) / ED DIAGNOSES  Final diagnoses:  Atypical chest pain        Note:  This document was prepared using Dragon voice recognition software and may include unintentional dictation errors.   Lavonia Drafts, MD 10/05/20 1350

## 2021-01-14 ENCOUNTER — Encounter: Payer: Self-pay | Admitting: Internal Medicine

## 2021-01-14 ENCOUNTER — Other Ambulatory Visit: Payer: Self-pay | Admitting: *Deleted

## 2021-01-14 NOTE — Progress Notes (Signed)
error 

## 2021-01-21 ENCOUNTER — Telehealth: Payer: Self-pay | Admitting: *Deleted

## 2021-01-21 NOTE — Telephone Encounter (Signed)
Called pt and got her voicemail. Left a message that Dr. Quay Burow her PCP had sent labs over and now has requested the pt. To come back and see Dr. Janese Banks. The last time she was here was in 2018. Would like to set up appt for pt this Thursday at 2:45. I have left my name and number and the date and time we would like to have the pt come in office. Will await a call from pt.

## 2021-01-23 ENCOUNTER — Other Ambulatory Visit: Payer: Self-pay

## 2021-01-23 ENCOUNTER — Inpatient Hospital Stay: Payer: Medicare Other

## 2021-01-23 ENCOUNTER — Inpatient Hospital Stay: Payer: Medicare Other | Attending: Oncology | Admitting: Oncology

## 2021-01-23 ENCOUNTER — Encounter: Payer: Self-pay | Admitting: Oncology

## 2021-01-23 VITALS — BP 154/68 | HR 70 | Temp 97.7°F | Resp 16 | Ht 66.0 in | Wt 168.0 lb

## 2021-01-23 DIAGNOSIS — D649 Anemia, unspecified: Secondary | ICD-10-CM

## 2021-01-23 DIAGNOSIS — R5383 Other fatigue: Secondary | ICD-10-CM | POA: Insufficient documentation

## 2021-01-23 DIAGNOSIS — N189 Chronic kidney disease, unspecified: Secondary | ICD-10-CM | POA: Diagnosis not present

## 2021-01-23 DIAGNOSIS — Z79899 Other long term (current) drug therapy: Secondary | ICD-10-CM | POA: Diagnosis not present

## 2021-01-23 DIAGNOSIS — R809 Proteinuria, unspecified: Secondary | ICD-10-CM | POA: Diagnosis not present

## 2021-01-23 DIAGNOSIS — D631 Anemia in chronic kidney disease: Secondary | ICD-10-CM | POA: Insufficient documentation

## 2021-01-23 DIAGNOSIS — D472 Monoclonal gammopathy: Secondary | ICD-10-CM | POA: Diagnosis not present

## 2021-01-23 DIAGNOSIS — Z87891 Personal history of nicotine dependence: Secondary | ICD-10-CM | POA: Insufficient documentation

## 2021-01-23 DIAGNOSIS — D8481 Immunodeficiency due to conditions classified elsewhere: Secondary | ICD-10-CM

## 2021-01-23 LAB — RETICULOCYTES
Immature Retic Fract: 13.9 % (ref 2.3–15.9)
RBC.: 3.29 MIL/uL — ABNORMAL LOW (ref 3.87–5.11)
Retic Count, Absolute: 61.5 10*3/uL (ref 19.0–186.0)
Retic Ct Pct: 1.9 % (ref 0.4–3.1)

## 2021-01-23 LAB — COMPREHENSIVE METABOLIC PANEL
ALT: 10 U/L (ref 0–44)
AST: 15 U/L (ref 15–41)
Albumin: 3.5 g/dL (ref 3.5–5.0)
Alkaline Phosphatase: 66 U/L (ref 38–126)
Anion gap: 5 (ref 5–15)
BUN: 45 mg/dL — ABNORMAL HIGH (ref 8–23)
CO2: 25 mmol/L (ref 22–32)
Calcium: 8.8 mg/dL — ABNORMAL LOW (ref 8.9–10.3)
Chloride: 105 mmol/L (ref 98–111)
Creatinine, Ser: 1.71 mg/dL — ABNORMAL HIGH (ref 0.44–1.00)
GFR, Estimated: 32 mL/min — ABNORMAL LOW (ref >60)
Glucose, Bld: 141 mg/dL — ABNORMAL HIGH (ref 70–99)
Potassium: 4 mmol/L (ref 3.5–5.1)
Sodium: 135 mmol/L (ref 135–145)
Total Bilirubin: 0.8 mg/dL (ref 0.3–1.2)
Total Protein: 7.4 g/dL (ref 6.5–8.1)

## 2021-01-23 LAB — CBC WITH DIFFERENTIAL/PLATELET
Abs Immature Granulocytes: 0.03 10*3/uL (ref 0.00–0.07)
Basophils Absolute: 0.1 10*3/uL (ref 0.0–0.1)
Basophils Relative: 1 %
Eosinophils Absolute: 0.1 10*3/uL (ref 0.0–0.5)
Eosinophils Relative: 1 %
HCT: 28.9 % — ABNORMAL LOW (ref 36.0–46.0)
Hemoglobin: 10 g/dL — ABNORMAL LOW (ref 12.0–15.0)
Immature Granulocytes: 0 %
Lymphocytes Relative: 27 %
Lymphs Abs: 2 10*3/uL (ref 0.7–4.0)
MCH: 30.7 pg (ref 26.0–34.0)
MCHC: 34.6 g/dL (ref 30.0–36.0)
MCV: 88.7 fL (ref 80.0–100.0)
Monocytes Absolute: 0.4 10*3/uL (ref 0.1–1.0)
Monocytes Relative: 6 %
Neutro Abs: 4.8 10*3/uL (ref 1.7–7.7)
Neutrophils Relative %: 65 %
Platelets: 266 10*3/uL (ref 150–400)
RBC: 3.26 MIL/uL — ABNORMAL LOW (ref 3.87–5.11)
RDW: 12.4 % (ref 11.5–15.5)
WBC: 7.4 10*3/uL (ref 4.0–10.5)
nRBC: 0 % (ref 0.0–0.2)

## 2021-01-23 LAB — IRON AND TIBC
Iron: 55 ug/dL (ref 28–170)
Saturation Ratios: 22 % (ref 10.4–31.8)
TIBC: 246 ug/dL — ABNORMAL LOW (ref 250–450)
UIBC: 191 ug/dL

## 2021-01-23 LAB — FOLATE: Folate: 9.3 ng/mL (ref >5.9)

## 2021-01-23 LAB — FERRITIN: Ferritin: 85 ng/mL (ref 11–307)

## 2021-01-23 LAB — VITAMIN B12: Vitamin B-12: 183 pg/mL (ref 180–914)

## 2021-01-23 LAB — TSH: TSH: 2.057 u[IU]/mL (ref 0.350–4.500)

## 2021-01-23 NOTE — Progress Notes (Unsigned)
Pt was seen a few years ago and has monoclonal gammopathy, and anemia of chronic kidney disease. She has been experiencing irritation in her esophagus. Her PCP put her on protonix and she says it feels a little better. She occ. Has tingling in left hand. She says when her esophagus bothers her she feels like her heart rate can go up. No nausea or vomiting. Pt has been depressed in the past but now she feels ok

## 2021-01-23 NOTE — Progress Notes (Unsigned)
Hematology/Oncology Consult note [facility] Telephone:([phone] [phone]  Patient Care Team: Ellamae Sia, MD as PCP - General (Internal Medicine)   Name of the patient: Jennifer Wood  [id number]  07-May-1951    Reason for referral-anemia   Referring physician-Dr. 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  She has now been referred to reestablish care.  Results of bloodwork from 03/09/2017 were as follows: CBC showed white count of 9.6, H&H of 10.3/29.9 and a platelet count of 301. CMP was significant for BUN of 39 and creatinine of 1.56. Reticulocyte count was 1.4% and low for the degree of anemia. Ferritin was normal at 51 and iron studies are within normal limits. B12 and folate were within normal limits. TSH was normal at 1.49. Haptoglobin was normal at 187. ESR was elevated at 70. Multiple myeloma panel showed elevated IgG of 1852. Serum monoclonal protein of 0.7 g was noted. Lambda light chain specificity. LDH was normal at 135 and beta-2 microglobulin was elevated at 4.2. 24-hour urine protein revealed 1gm proteinuria and 39 mg monoclonal protein in 24 hours.   Bone marrow biopsy on 04/20/17 showed normocellular marrow with trilineage hematopoiesis and mild increase in plasma cells 9% (5-10%)  Most recent labs from 12/16/2020 showed white count of 7.5, H&H of 10.6/31.9 with a platelet count of 288.  Serum creatinine was elevated at 1.5.  Serum calcium was normal at 9.4 and total protein normal at 7.1.  M spike was 0.9 on serum protein electrophoresis.  ECOG PS- 2  Pain scale- 3   Review of systems- Review of Systems  Constitutional: Positive for malaise/fatigue. Negative for chills, fever and weight loss.  HENT: Negative for congestion,  ear discharge and nosebleeds.   Eyes: Negative for blurred vision.  Respiratory: Negative for cough, hemoptysis, sputum production, shortness of breath and wheezing.   Cardiovascular: Negative for chest pain, palpitations, orthopnea and claudication.  Gastrointestinal: Negative for abdominal pain, blood in stool, constipation, diarrhea, heartburn, melena, nausea and vomiting.  Genitourinary: Negative for dysuria, flank pain, frequency, hematuria and urgency.  Musculoskeletal: Negative for back pain, joint pain and myalgias.  Skin: Negative for rash.  Neurological: Negative for dizziness, tingling, focal weakness, seizures, weakness and headaches.  Endo/Heme/Allergies: Does not bruise/bleed easily.  Psychiatric/Behavioral: Negative for depression and suicidal ideas. The patient does not have insomnia.     Allergies  Allergen Reactions  . Tomato     Patient Active Problem List   Diagnosis Date Noted  . MGUS (monoclonal gammopathy of unknown significance) 11/05/2017  . Schizophrenia ([place]) 03/09/2017  . Hypertension 03/09/2017  . Diabetes ([place]) 03/09/2017  . Depression 03/09/2017     Past Medical History:  Diagnosis Date  . Depression   . Diabetes mellitus without complication ([place])   . Hypercholesteremia   . Hypertension   . Schizophrenia ([place])    pt denies this dx     Past Surgical History:  Procedure Laterality Date  . CATARACT EXTRACTION W/PHACO Left 03/11/2016   Procedure: CATARACT EXTRACTION PHACO AND INTRAOCULAR LENS PLACEMENT (IOC);  Surgeon: Leandrew Koyanagi, MD;  Location: [place];  Service: Ophthalmology;  Laterality: Left;  DIABETIC - insulin  . ORIF TIBIA PLATEAU Right 09/05/06   [facility]  . TUBAL LIGATION  Social History   Socioeconomic History  . Marital status: Divorced    Spouse name: Not on file  . Number of children: Not on file  . Years of education: Not on file  . Highest education level: Not on file  Occupational History  . Not on  file  Tobacco Use  . Smoking status: Former Smoker    Packs/day: 0.25    Years: 5.00    Pack years: 1.25  . Smokeless tobacco: Current User    Types: Snuff  Substance and Sexual Activity  . Alcohol use: No  . Drug use: No  . Sexual activity: Never  Other Topics Concern  . Not on file  Social History Narrative  . Not on file   Social Determinants of Health   Financial Resource Strain: Not on file  Food Insecurity: Not on file  Transportation Needs: Not on file  Physical Activity: Not on file  Stress: Not on file  Social Connections: Not on file  Intimate Partner Violence: Not on file     Family History  Problem Relation Age of Onset  . Breast cancer Neg Hx      Current Outpatient Medications:  .  B-D UF III MINI PEN NEEDLES 31G X 5 MM MISC, , Disp: , Rfl:  .  Cholecalciferol (VITAMIN D PO), Take by mouth., Disp: , Rfl:  .  HYDROcodone-acetaminophen (NORCO/VICODIN) 5-325 MG tablet, Take 2 tablets by mouth every 6 (six) hours as needed for moderate pain or severe pain., Disp: 16 tablet, Rfl: 0 .  insulin aspart (NOVOLOG) 100 UNIT/ML injection, Inject 14 Units into the skin once. , Disp: , Rfl:  .  insulin glargine (LANTUS) 100 UNIT/ML injection, Inject 10 Units into the skin at bedtime. , Disp: , Rfl:  .  lisinopril (PRINIVIL,ZESTRIL) 20 MG tablet, Take 20 mg by mouth 2 (two) times daily., Disp: , Rfl:  .  METOPROLOL TARTRATE PO, Take 100 mg by mouth daily. , Disp: , Rfl:  .  pantoprazole (PROTONIX) 20 MG tablet, Take 1 tablet (20 mg total) by mouth daily., Disp: 30 tablet, Rfl: 1 .  predniSONE (DELTASONE) 10 MG tablet, Take 4 tabs (40 mg) PO x 3 days, then take 2 tabs (20 mg) PO x 3 days, then take 1 tab (10 mg) PO x 3 days, then take 1/2 tab (5 mg) PO x 4 days., Disp: 23 tablet, Rfl: 0 .  TRUE METRIX BLOOD GLUCOSE TEST test strip, , Disp: , Rfl:  .  TRUEPLUS LANCETS 33G MISC, , Disp: , Rfl:    Physical exam:  Vitals:   01/23/21 1341  BP: (!) 154/68  Pulse: 70   Resp: 16  Temp: 97.7 F (36.5 C)  TempSrc: Oral  Weight: 168 lb (76.2 kg)  Height: 5' 6"  (1.676 m)   Physical Exam Constitutional:      General: She is not in acute distress. HENT:     Head: Atraumatic.  Eyes:     Extraocular Movements: EOM normal.  Cardiovascular:     Rate and Rhythm: Normal rate and regular rhythm.     Heart sounds: Normal heart sounds.  Pulmonary:     Effort: Pulmonary effort is normal.     Breath sounds: Normal breath sounds.  Abdominal:     General: Bowel sounds are normal.     Palpations: Abdomen is soft.  Skin:    General: Skin is warm and dry.  Neurological:     Mental Status: She is alert and oriented to  person, place, and time.        CMP Latest Ref Rng & Units 10/05/2020  Glucose 70 - 99 mg/dL 134(H)  BUN 8 - 23 mg/dL 45(H)  Creatinine 0.44 - 1.00 mg/dL 1.82(H)  Sodium 135 - 145 mmol/L 138  Potassium 3.5 - 5.1 mmol/L 4.0  Chloride 98 - 111 mmol/L 108  CO2 22 - 32 mmol/L 24  Calcium 8.9 - 10.3 mg/dL 8.7(L)  Total Protein 6.5 - 8.1 g/dL -  Total Bilirubin 0.3 - 1.2 mg/dL -  Alkaline Phos 38 - 126 U/L -  AST 15 - 41 U/L -  ALT 0 - 44 U/L -   CBC Latest Ref Rng & Units 10/05/2020  WBC 4.0 - 10.5 K/uL 6.5  Hemoglobin 12.0 - 15.0 g/dL 9.6(L)  Hematocrit 36.0 - 46.0 % 28.3(L)  Platelets 150 - 400 K/uL 251     Assessment and plan- Patient is a 70 y.o. female with history of IgG MGUS referred to reestablish care  Back in 2018 when patient was following up with me for her MGUS her hemoglobin was 10.3 and based on her recent labs her hemoglobin seems to be stable around that range.  She was noted to have an M protein of 0.7 g back in 2018 and the most recent 1 showed an M protein of 0.9.  Patient does have a component of anemia of chronic kidney disease as well.  Today I will do a complete anemia work-up including CBC with differential, ferritin and iron studies, B12 and folate, reticulocyte count, TSH, haptoglobin, serum free light chains  and myeloma panel.  If there is no reversible cause of anemia on her peripheral blood work-up and her hemoglobin is consistently less than 10 we could consider initiating EPO for anemia of chronic kidney disease  I will see her back in 2 weeks for an in person or video visit   Thank you for this kind referral and the opportunity to participate in the care of this patient   Visit Diagnosis 1. MGUS (monoclonal gammopathy of unknown significance)   2. Normocytic anemia     Dr. Randa Evens, MD, MPH [facility] [phone] 01/23/2021 1:46 PM

## 2021-01-24 ENCOUNTER — Telehealth: Payer: Self-pay | Admitting: *Deleted

## 2021-01-24 LAB — KAPPA/LAMBDA LIGHT CHAINS
Kappa free light chain: 71.7 mg/L — ABNORMAL HIGH (ref 3.3–19.4)
Kappa, lambda light chain ratio: 1 (ref 0.26–1.65)
Lambda free light chains: 71.6 mg/L — ABNORMAL HIGH (ref 5.7–26.3)

## 2021-01-24 LAB — HAPTOGLOBIN: Haptoglobin: 122 mg/dL (ref 37–355)

## 2021-01-24 NOTE — Telephone Encounter (Signed)
Pt received pfizer vaccines 1st dose on 04-02-2020, 2nd dose on 04-25-2020 at [place] and booster at [place]

## 2021-01-27 LAB — MULTIPLE MYELOMA PANEL, SERUM
Albumin SerPl Elph-Mcnc: 3.3 g/dL (ref 2.9–4.4)
Albumin/Glob SerPl: 1 (ref 0.7–1.7)
Alpha 1: 0.2 g/dL (ref 0.0–0.4)
Alpha2 Glob SerPl Elph-Mcnc: 0.7 g/dL (ref 0.4–1.0)
B-Globulin SerPl Elph-Mcnc: 1 g/dL (ref 0.7–1.3)
Gamma Glob SerPl Elph-Mcnc: 1.7 g/dL (ref 0.4–1.8)
Globulin, Total: 3.6 g/dL (ref 2.2–3.9)
IgA: 197 mg/dL (ref 87–352)
IgG (Immunoglobin G), Serum: 1904 mg/dL — ABNORMAL HIGH (ref 586–1602)
IgM (Immunoglobulin M), Srm: 119 mg/dL (ref 26–217)
M Protein SerPl Elph-Mcnc: 0.8 g/dL — ABNORMAL HIGH
Total Protein ELP: 6.9 g/dL (ref 6.0–8.5)

## 2021-01-28 LAB — PROTEIN ELECTRO, RANDOM URINE
Albumin ELP, Urine: 69.7 %
Alpha-1-Globulin, U: 1.1 %
Alpha-2-Globulin, U: 5.2 %
Beta Globulin, U: 10.9 %
Gamma Globulin, U: 13.1 %
M Component, Ur: 5.2 % — ABNORMAL HIGH
Total Protein, Urine: 103.7 mg/dL

## 2021-02-07 ENCOUNTER — Encounter: Payer: Self-pay | Admitting: Oncology

## 2021-02-07 ENCOUNTER — Other Ambulatory Visit: Payer: Self-pay

## 2021-02-07 ENCOUNTER — Ambulatory Visit: Payer: Medicare Other | Admitting: Oncology

## 2021-02-07 ENCOUNTER — Inpatient Hospital Stay: Payer: Medicare Other

## 2021-02-07 ENCOUNTER — Inpatient Hospital Stay: Payer: Medicare Other | Attending: Oncology | Admitting: Oncology

## 2021-02-07 VITALS — BP 160/79 | HR 70 | Temp 96.4°F | Resp 20 | Wt 166.1 lb

## 2021-02-07 DIAGNOSIS — N183 Chronic kidney disease, stage 3 unspecified: Secondary | ICD-10-CM | POA: Insufficient documentation

## 2021-02-07 DIAGNOSIS — D631 Anemia in chronic kidney disease: Secondary | ICD-10-CM | POA: Insufficient documentation

## 2021-02-07 DIAGNOSIS — Z87891 Personal history of nicotine dependence: Secondary | ICD-10-CM | POA: Diagnosis not present

## 2021-02-07 DIAGNOSIS — D472 Monoclonal gammopathy: Secondary | ICD-10-CM | POA: Insufficient documentation

## 2021-02-07 DIAGNOSIS — Z79899 Other long term (current) drug therapy: Secondary | ICD-10-CM | POA: Diagnosis not present

## 2021-02-07 DIAGNOSIS — E538 Deficiency of other specified B group vitamins: Secondary | ICD-10-CM

## 2021-02-07 DIAGNOSIS — Z833 Family history of diabetes mellitus: Secondary | ICD-10-CM | POA: Insufficient documentation

## 2021-02-07 DIAGNOSIS — R5382 Chronic fatigue, unspecified: Secondary | ICD-10-CM | POA: Insufficient documentation

## 2021-02-07 MED ORDER — CYANOCOBALAMIN 1000 MCG/ML IJ SOLN
1000.0000 ug | Freq: Once | INTRAMUSCULAR | Status: AC
  Administered 2021-02-07: 1000 ug via INTRAMUSCULAR
  Filled 2021-02-07: qty 1

## 2021-02-07 NOTE — Progress Notes (Signed)
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  She has now been referred to reestablish care.  Results of bloodwork from 03/09/2017 were as follows: CBC showed white count of 9.6, H&H of 10.3/29.9 and a platelet count of 301. CMP was significant for BUN of 39 and creatinine of 1.56. Reticulocyte count was 1.4% and low for the degree of anemia. Ferritin was normal at 51 and iron studies are within normal limits. B12 and folate were within normal limits. TSH was normal at 1.49. Haptoglobin was normal at 187. ESR was elevated at 70. Multiple myeloma panel showed elevated IgG of 1852. Serum monoclonal protein of 0.7 g was noted. Lambda light chain specificity. LDH was normal at 135 and beta-2 microglobulin was elevated at 4.2. 24-hour urine protein revealed 1gm proteinuria and 39 mg monoclonal protein in 24 hours.   Bone marrow biopsy on 04/20/17 showed normocellular marrow with trilineage hematopoiesis and mild increase in plasma cells 9% (5-10%)  Most recent labs from 12/16/2020 showed white count of 7.5, H&H of 10.6/31.9 with a platelet count of 288.  Serum creatinine was elevated at 1.5.  Serum calcium was normal at 9.4 and total protein normal at 7.1.  M spike was 0.9 on serum protein electrophoresis.  Interval history-patient has baseline chronic fatigue.  Denies any new complaints at  this time  ECOG PS- 1 Pain scale- 0   Review of systems- Review of Systems  Constitutional: Positive for malaise/fatigue. Negative for chills, fever and weight loss.  HENT: Negative for congestion, ear discharge and nosebleeds.   Eyes: Negative for blurred vision.  Respiratory: Negative for cough, hemoptysis, sputum production, shortness of breath and wheezing.   Cardiovascular: Negative for chest pain, palpitations, orthopnea and claudication.  Gastrointestinal: Negative for abdominal pain, blood in stool, constipation, diarrhea, heartburn, melena, nausea and vomiting.  Genitourinary: Negative for dysuria, flank pain, frequency, hematuria and urgency.  Musculoskeletal: Negative for back pain, joint pain and myalgias.  Skin: Negative for rash.  Neurological: Negative for dizziness, tingling, focal weakness, seizures, weakness and headaches.  Endo/Heme/Allergies: Does not bruise/bleed easily.  Psychiatric/Behavioral: Negative for depression and suicidal ideas. The patient does not have insomnia.       Allergies  Allergen Reactions  . Tomato      Past Medical History:  Diagnosis Date  . Anemia   . Depression   . Diabetes mellitus without complication ([place])   . Hypercholesteremia   . Hypertension   . Monoclonal gammopathy   . Schizophrenia ([place])    pt denies this dx     Past Surgical History:  Procedure Laterality Date  . CATARACT EXTRACTION W/PHACO Left 03/11/2016   Procedure: CATARACT EXTRACTION PHACO AND INTRAOCULAR LENS PLACEMENT (IOC);  Surgeon: Leandrew Koyanagi, MD;  Location: [place];  Service: Ophthalmology;  Laterality: Left;  DIABETIC - insulin  . ORIF TIBIA PLATEAU Right 09/05/06   [facility]  . TUBAL LIGATION  Social History   Socioeconomic History  . Marital status: Divorced    Spouse name: Not on file  . Number of children: Not on file  . Years of education: Not on file  . Highest education level: Not on file  Occupational History  . Not  on file  Tobacco Use  . Smoking status: Former Smoker    Packs/day: 0.25    Years: 5.00    Pack years: 1.25  . Smokeless tobacco: Former Systems developer    Types: Snuff  Vaping Use  . Vaping Use: Never used  Substance and Sexual Activity  . Alcohol use: No  . Drug use: No  . Sexual activity: Never  Other Topics Concern  . Not on file  Social History Narrative  . Not on file   Social Determinants of Health   Financial Resource Strain: Not on file  Food Insecurity: Not on file  Transportation Needs: Not on file  Physical Activity: Not on file  Stress: Not on file  Social Connections: Not on file  Intimate Partner Violence: Not on file    Family History  Problem Relation Age of Onset  . Diabetes Sister   . Breast cancer Neg Hx      Current Outpatient Medications:  .  lisinopril (PRINIVIL,ZESTRIL) 20 MG tablet, Take 20 mg by mouth 2 (two) times daily., Disp: , Rfl:  .  sucralfate (CARAFATE) 1 g tablet, Take 1 g by mouth 4 (four) times daily., Disp: , Rfl:  .  TRUE METRIX BLOOD GLUCOSE TEST test strip, , Disp: , Rfl:  .  TRUEPLUS LANCETS 33G MISC, , Disp: , Rfl:   Physical exam:  Vitals:   02/07/21 1016  BP: (!) 160/79  Pulse: 70  Resp: 20  Temp: (!) 96.4 F (35.8 C)  TempSrc: Tympanic  SpO2: 100%  Weight: 166 lb 1.6 oz (75.3 kg)   Physical Exam Constitutional:      General: She is not in acute distress. Eyes:     Extraocular Movements: EOM normal.  Cardiovascular:     Rate and Rhythm: Normal rate and regular rhythm.     Heart sounds: Normal heart sounds.  Pulmonary:     Effort: Pulmonary effort is normal.     Breath sounds: Normal breath sounds.  Abdominal:     General: Bowel sounds are normal.     Palpations: Abdomen is soft.  Skin:    General: Skin is warm and dry.  Neurological:     Mental Status: She is alert and oriented to person, place, and time.      CMP Latest Ref Rng & Units 01/23/2021  Glucose 70 - 99 mg/dL 141(H)  BUN 8 - 23 mg/dL 45(H)   Creatinine 0.44 - 1.00 mg/dL 1.71(H)  Sodium 135 - 145 mmol/L 135  Potassium 3.5 - 5.1 mmol/L 4.0  Chloride 98 - 111 mmol/L 105  CO2 22 - 32 mmol/L 25  Calcium 8.9 - 10.3 mg/dL 8.8(L)  Total Protein 6.5 - 8.1 g/dL 7.4  Total Bilirubin 0.3 - 1.2 mg/dL 0.8  Alkaline Phos 38 - 126 U/L 66  AST 15 - 41 U/L 15  ALT 0 - 44 U/L 10   CBC Latest Ref Rng & Units 01/23/2021  WBC 4.0 - 10.5 K/uL 7.4  Hemoglobin 12.0 - 15.0 g/dL 10.0(L)  Hematocrit 36.0 - 46.0 % 28.9(L)  Platelets 150 - 400 K/uL 266     Assessment and plan- Patient is a 70 y.o. female with normocytic anemia likely multifactorial  secondary to anemia of chronic kidney disease and B12 deficiency  Patient's hemoglobin is currently 10 and I will consider starting EPO down the line if her hemoglobin drops down to 9 or less.  Iron saturation is more than 20% but ferritin I mildly low iron at 85.  I will hold off on giving her IV iron for now but consider in the future if we are at a point of giving her EPO to keep ferritin close to 200 given that she has kidney disease.  B12 deficiency: She will proceed with B12 shot today and we will give her monthly shots for the next 3 months and I will see her in 3 months with CBC ferritin iron studies and B12   Visit Diagnosis 1. MGUS (monoclonal gammopathy of unknown significance)   2. B12 deficiency   3. Anemia of chronic kidney failure, stage 3 (moderate) (HCC)      Dr. Randa Evens, MD, MPH [facility] at [facility] [phone] 02/07/2021 12:09 PM

## 2021-03-07 ENCOUNTER — Inpatient Hospital Stay: Payer: Medicare Other | Attending: Oncology

## 2021-03-07 ENCOUNTER — Inpatient Hospital Stay: Payer: Medicare Other

## 2021-03-07 DIAGNOSIS — D631 Anemia in chronic kidney disease: Secondary | ICD-10-CM | POA: Insufficient documentation

## 2021-03-07 DIAGNOSIS — Z79899 Other long term (current) drug therapy: Secondary | ICD-10-CM | POA: Insufficient documentation

## 2021-03-07 DIAGNOSIS — D472 Monoclonal gammopathy: Secondary | ICD-10-CM | POA: Diagnosis present

## 2021-03-07 DIAGNOSIS — N183 Chronic kidney disease, stage 3 unspecified: Secondary | ICD-10-CM | POA: Insufficient documentation

## 2021-03-07 DIAGNOSIS — E538 Deficiency of other specified B group vitamins: Secondary | ICD-10-CM | POA: Insufficient documentation

## 2021-03-07 MED ORDER — CYANOCOBALAMIN 1000 MCG/ML IJ SOLN
1000.0000 ug | INTRAMUSCULAR | Status: DC
  Administered 2021-03-07: 1000 ug via INTRAMUSCULAR
  Filled 2021-03-07: qty 1

## 2021-04-04 ENCOUNTER — Inpatient Hospital Stay: Payer: Medicare Other

## 2021-04-04 ENCOUNTER — Other Ambulatory Visit: Payer: Self-pay

## 2021-04-04 ENCOUNTER — Inpatient Hospital Stay: Payer: Medicare Other | Attending: Oncology

## 2021-04-04 DIAGNOSIS — D472 Monoclonal gammopathy: Secondary | ICD-10-CM | POA: Diagnosis not present

## 2021-04-04 DIAGNOSIS — E538 Deficiency of other specified B group vitamins: Secondary | ICD-10-CM | POA: Insufficient documentation

## 2021-04-04 MED ORDER — CYANOCOBALAMIN 1000 MCG/ML IJ SOLN
1000.0000 ug | INTRAMUSCULAR | Status: DC
  Administered 2021-04-04: 1000 ug via INTRAMUSCULAR
  Filled 2021-04-04: qty 1

## 2021-05-08 ENCOUNTER — Other Ambulatory Visit: Payer: Medicare Other

## 2021-05-08 ENCOUNTER — Ambulatory Visit: Payer: Medicare Other

## 2021-05-08 ENCOUNTER — Ambulatory Visit: Payer: Medicare Other | Admitting: Oncology

## 2021-05-08 ENCOUNTER — Inpatient Hospital Stay: Payer: Medicare Other

## 2021-05-08 ENCOUNTER — Inpatient Hospital Stay: Payer: Medicare Other | Attending: Oncology

## 2021-05-08 ENCOUNTER — Other Ambulatory Visit: Payer: Self-pay

## 2021-05-08 ENCOUNTER — Inpatient Hospital Stay (HOSPITAL_BASED_OUTPATIENT_CLINIC_OR_DEPARTMENT_OTHER): Payer: Medicare Other | Admitting: Oncology

## 2021-05-08 ENCOUNTER — Encounter: Payer: Self-pay | Admitting: Oncology

## 2021-05-08 VITALS — BP 150/80 | HR 72 | Temp 97.7°F | Resp 16 | Ht 66.0 in | Wt 170.5 lb

## 2021-05-08 DIAGNOSIS — R7402 Elevation of levels of lactic acid dehydrogenase (LDH): Secondary | ICD-10-CM | POA: Insufficient documentation

## 2021-05-08 DIAGNOSIS — R5383 Other fatigue: Secondary | ICD-10-CM | POA: Insufficient documentation

## 2021-05-08 DIAGNOSIS — Z833 Family history of diabetes mellitus: Secondary | ICD-10-CM | POA: Insufficient documentation

## 2021-05-08 DIAGNOSIS — R768 Other specified abnormal immunological findings in serum: Secondary | ICD-10-CM | POA: Diagnosis not present

## 2021-05-08 DIAGNOSIS — D631 Anemia in chronic kidney disease: Secondary | ICD-10-CM | POA: Diagnosis not present

## 2021-05-08 DIAGNOSIS — N183 Chronic kidney disease, stage 3 unspecified: Secondary | ICD-10-CM | POA: Insufficient documentation

## 2021-05-08 DIAGNOSIS — E538 Deficiency of other specified B group vitamins: Secondary | ICD-10-CM | POA: Insufficient documentation

## 2021-05-08 DIAGNOSIS — Z79899 Other long term (current) drug therapy: Secondary | ICD-10-CM | POA: Insufficient documentation

## 2021-05-08 DIAGNOSIS — D472 Monoclonal gammopathy: Secondary | ICD-10-CM

## 2021-05-08 LAB — FERRITIN: Ferritin: 73 ng/mL (ref 11–307)

## 2021-05-08 LAB — CBC
HCT: 29.8 % — ABNORMAL LOW (ref 36.0–46.0)
Hemoglobin: 9.9 g/dL — ABNORMAL LOW (ref 12.0–15.0)
MCH: 30.1 pg (ref 26.0–34.0)
MCHC: 33.2 g/dL (ref 30.0–36.0)
MCV: 90.6 fL (ref 80.0–100.0)
Platelets: 263 10*3/uL (ref 150–400)
RBC: 3.29 MIL/uL — ABNORMAL LOW (ref 3.87–5.11)
RDW: 12.4 % (ref 11.5–15.5)
WBC: 7.7 10*3/uL (ref 4.0–10.5)
nRBC: 0 % (ref 0.0–0.2)

## 2021-05-08 LAB — VITAMIN B12: Vitamin B-12: 367 pg/mL (ref 180–914)

## 2021-05-08 LAB — IRON AND TIBC
Iron: 73 ug/dL (ref 28–170)
Saturation Ratios: 26 % (ref 10.4–31.8)
TIBC: 280 ug/dL (ref 250–450)
UIBC: 207 ug/dL

## 2021-05-08 MED ORDER — CYANOCOBALAMIN 1000 MCG/ML IJ SOLN
1000.0000 ug | Freq: Once | INTRAMUSCULAR | Status: AC
  Administered 2021-05-08: 1000 ug via INTRAMUSCULAR
  Filled 2021-05-08: qty 1

## 2021-05-08 NOTE — Progress Notes (Signed)
Hematology/Oncology Consult note [facility]  Telephone:([phone] [phone]  Patient Care Team: Ellamae Sia, MD as PCP - General (Internal Medicine)   Name of the patient: Jennifer Wood  [id number]  [birth year]   Date of visit: 05/08/21  Diagnosis- normocytic anemia: Likely multifactorial secondary to B12 deficiency and anemia of chronic kidney disease.  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 She has now been referred to reestablish care.  Results of bloodwork from 03/09/2017 were as follows: CBC showed white count of 9.6, H&H of 10.3/29.9 and a platelet count of 301. CMP was significant for BUN of 39 and creatinine of 1.56. Reticulocyte count was 1.4% and low for the degree of anemia. Ferritin was normal at 51 and iron studies are within normal limits. B12 and folate were within normal limits. TSH was normal at 1.49. Haptoglobin was normal at 187. ESR was elevated at 70. Multiple myeloma panel showed elevated IgG of 1852. Serum monoclonal protein of 0.7 g was noted. Lambda light chain specificity. LDH was normal at 135 and beta-2 microglobulin was elevated at 4.2. 24-hour urine protein revealed 1gm proteinuria and 39 mg monoclonal protein in 24 hours.   Bone marrow biopsy on 04/20/17 showed normocellular marrow with trilineage hematopoiesis and mild increase in plasma cells 9% (5-10%)  Most recent labs from 12/16/2020 showed white count of 7.5, H&H of 10.6/31.9 with a platelet count of 288. Serum creatinine was elevated at 1.5. Serum calcium was normal at 9.4 and total protein normal at 7.1. M spike was 0.9 on serum protein electrophoresis.   Interval history-patient reports some baseline fatigue.  Denies any new complaints at  this time.  Denies any blood loss in her stool or urine  ECOG PS- 1 Pain scale- 0   Review of systems- Review of Systems  Constitutional: Positive for malaise/fatigue. Negative for chills, fever and weight loss.  HENT: Negative for congestion, ear discharge and nosebleeds.   Eyes: Negative for blurred vision.  Respiratory: Negative for cough, hemoptysis, sputum production, shortness of breath and wheezing.   Cardiovascular: Negative for chest pain, palpitations, orthopnea and claudication.  Gastrointestinal: Negative for abdominal pain, blood in stool, constipation, diarrhea, heartburn, melena, nausea and vomiting.  Genitourinary: Negative for dysuria, flank pain, frequency, hematuria and urgency.  Musculoskeletal: Negative for back pain, joint pain and myalgias.  Skin: Negative for rash.  Neurological: Negative for dizziness, tingling, focal weakness, seizures, weakness and headaches.  Endo/Heme/Allergies: Does not bruise/bleed easily.  Psychiatric/Behavioral: Negative for depression and suicidal ideas. The patient does not have insomnia.      No Active Allergies   Past Medical History:  Diagnosis Date  . Anemia   . Cataract    right eye getting it fixed 06/11/2021  . Depression   . Diabetes mellitus without complication ([place])   . Hypercholesteremia   . Hypertension   . Monoclonal gammopathy   . Schizophrenia ([place])    pt denies this dx     Past Surgical History:  Procedure Laterality Date  . CATARACT EXTRACTION W/PHACO Left 03/11/2016   Procedure: CATARACT EXTRACTION PHACO AND INTRAOCULAR LENS PLACEMENT (IOC);  Surgeon: Leandrew Koyanagi, MD;  Location: [place];  Service: Ophthalmology;  Laterality: Left;  DIABETIC - insulin  . ORIF TIBIA PLATEAU Right  09/05/06   [facility]  . TUBAL LIGATION      Social History   Socioeconomic History  . Marital status: Divorced    Spouse name: Not on file  . Number of children: Not on file  . Years of education: Not on file   . Highest education level: Not on file  Occupational History  . Not on file  Tobacco Use  . Smoking status: Never Smoker  . Smokeless tobacco: Former Systems developer    Types: Snuff  Vaping Use  . Vaping Use: Never used  Substance and Sexual Activity  . Alcohol use: No  . Drug use: No  . Sexual activity: Never  Other Topics Concern  . Not on file  Social History Narrative  . Not on file   Social Determinants of Health   Financial Resource Strain: Not on file  Food Insecurity: Not on file  Transportation Needs: Not on file  Physical Activity: Not on file  Stress: Not on file  Social Connections: Not on file  Intimate Partner Violence: Not on file    Family History  Problem Relation Age of Onset  . Diabetes Sister   . Breast cancer Neg Hx     No current outpatient medications on file.  Physical exam:  Vitals:   05/08/21 1137  BP: (!) 150/80  Pulse: 72  Resp: 16  Temp: 97.7 F (36.5 C)  TempSrc: Oral  Weight: 170 lb 8 oz (77.3 kg)  Height: 5' 6"  (1.676 m)   Physical Exam Constitutional:      General: She is not in acute distress. Cardiovascular:     Rate and Rhythm: Normal rate and regular rhythm.     Heart sounds: Normal heart sounds.  Pulmonary:     Effort: Pulmonary effort is normal.     Breath sounds: Normal breath sounds.  Abdominal:     General: Bowel sounds are normal.     Palpations: Abdomen is soft.  Skin:    General: Skin is warm and dry.  Neurological:     Mental Status: She is alert and oriented to person, place, and time.      CMP Latest Ref Rng & Units 01/23/2021  Glucose 70 - 99 mg/dL 141(H)  BUN 8 - 23 mg/dL 45(H)  Creatinine 0.44 - 1.00 mg/dL 1.71(H)  Sodium 135 - 145 mmol/L 135  Potassium 3.5 - 5.1 mmol/L 4.0  Chloride 98 - 111 mmol/L 105  CO2 22 - 32 mmol/L 25  Calcium 8.9 - 10.3 mg/dL 8.8(L)  Total Protein 6.5 - 8.1 g/dL 7.4  Total Bilirubin 0.3 - 1.2 mg/dL 0.8  Alkaline Phos 38 - 126 U/L 66  AST 15 - 41 U/L 15  ALT 0 - 44 U/L 10    CBC Latest Ref Rng & Units 05/08/2021  WBC 4.0 - 10.5 K/uL 7.7  Hemoglobin 12.0 - 15.0 g/dL 9.9(L)  Hematocrit 36.0 - 46.0 % 29.8(L)  Platelets 150 - 400 K/uL 263      Assessment and plan- Patient is a 70 y.o. female with chronic normocytic anemia likely anemia of chronic kidney disease here for routine follow-up  1.  Anemia of chronic kidney disease:Currently hemoglobin is stable between 9.5-10.5.  She has not required EPO so far.  Hemoglobin today is 9.9.  Continue to monitor.  Iron studies show iron saturation of 26% and ferritin of 73 which is acceptable and I would not give her any IV iron at this time.  B12 levels are normal  2.  MGUS: Repeat myeloma panel and serum free light chains in 6 months   Visit Diagnosis 1. MGUS (monoclonal gammopathy of unknown significance)   2. Anemia of chronic kidney failure, stage 3 (moderate) (HCC)      Dr. Randa Evens, MD, MPH [facility] [phone] 05/08/2021 9:10 PM

## 2021-06-11 ENCOUNTER — Ambulatory Visit: Admit: 2021-06-11 | Payer: Medicare Other | Admitting: Ophthalmology

## 2021-06-11 SURGERY — PHACOEMULSIFICATION, CATARACT, WITH IOL INSERTION
Anesthesia: Topical | Laterality: Right

## 2021-09-12 ENCOUNTER — Emergency Department: Payer: Medicare Other

## 2021-09-12 DIAGNOSIS — E119 Type 2 diabetes mellitus without complications: Secondary | ICD-10-CM | POA: Diagnosis not present

## 2021-09-12 DIAGNOSIS — M109 Gout, unspecified: Secondary | ICD-10-CM | POA: Diagnosis not present

## 2021-09-12 DIAGNOSIS — R918 Other nonspecific abnormal finding of lung field: Secondary | ICD-10-CM | POA: Diagnosis not present

## 2021-09-12 DIAGNOSIS — R7989 Other specified abnormal findings of blood chemistry: Secondary | ICD-10-CM | POA: Insufficient documentation

## 2021-09-12 DIAGNOSIS — Z87891 Personal history of nicotine dependence: Secondary | ICD-10-CM | POA: Diagnosis not present

## 2021-09-12 DIAGNOSIS — I10 Essential (primary) hypertension: Secondary | ICD-10-CM | POA: Diagnosis not present

## 2021-09-12 DIAGNOSIS — D649 Anemia, unspecified: Secondary | ICD-10-CM | POA: Diagnosis not present

## 2021-09-12 DIAGNOSIS — K2970 Gastritis, unspecified, without bleeding: Secondary | ICD-10-CM | POA: Insufficient documentation

## 2021-09-12 DIAGNOSIS — R109 Unspecified abdominal pain: Secondary | ICD-10-CM | POA: Diagnosis present

## 2021-09-12 LAB — COMPREHENSIVE METABOLIC PANEL
ALT: 9 U/L (ref 0–44)
AST: 13 U/L — ABNORMAL LOW (ref 15–41)
Albumin: 3.1 g/dL — ABNORMAL LOW (ref 3.5–5.0)
Alkaline Phosphatase: 62 U/L (ref 38–126)
Anion gap: 7 (ref 5–15)
BUN: 42 mg/dL — ABNORMAL HIGH (ref 8–23)
CO2: 27 mmol/L (ref 22–32)
Calcium: 8.9 mg/dL (ref 8.9–10.3)
Chloride: 101 mmol/L (ref 98–111)
Creatinine, Ser: 1.96 mg/dL — ABNORMAL HIGH (ref 0.44–1.00)
GFR, Estimated: 27 mL/min — ABNORMAL LOW (ref >60)
Glucose, Bld: 183 mg/dL — ABNORMAL HIGH (ref 70–99)
Potassium: 4.4 mmol/L (ref 3.5–5.1)
Sodium: 135 mmol/L (ref 135–145)
Total Bilirubin: 0.8 mg/dL (ref 0.3–1.2)
Total Protein: 7.6 g/dL (ref 6.5–8.1)

## 2021-09-12 LAB — CBC
HCT: 28.5 % — ABNORMAL LOW (ref 36.0–46.0)
Hemoglobin: 10 g/dL — ABNORMAL LOW (ref 12.0–15.0)
MCH: 31.5 pg (ref 26.0–34.0)
MCHC: 35.1 g/dL (ref 30.0–36.0)
MCV: 89.9 fL (ref 80.0–100.0)
Platelets: 254 10*3/uL (ref 150–400)
RBC: 3.17 MIL/uL — ABNORMAL LOW (ref 3.87–5.11)
RDW: 12.2 % (ref 11.5–15.5)
WBC: 8.2 10*3/uL (ref 4.0–10.5)
nRBC: 0 % (ref 0.0–0.2)

## 2021-09-12 LAB — TROPONIN I (HIGH SENSITIVITY)
Troponin I (High Sensitivity): 7 ng/L (ref <18)
Troponin I (High Sensitivity): 9 ng/L (ref <18)

## 2021-09-12 LAB — LIPASE, BLOOD: Lipase: 30 U/L (ref 11–51)

## 2021-09-12 NOTE — ED Triage Notes (Addendum)
Pt c/o intermittent upper abdominal and epigastric pain x 1 year and R foot pain and swelling x 1 day.  Pain score 5/10.  Denies n/v/d.  Pt reports epigastric pain is worse in the morning.   Hx of GERD and gout.    Pt reports taking Pantoprazole w/o relief.

## 2021-09-12 NOTE — ED Triage Notes (Signed)
First Nurse Note:  Arrives from home via [facility] for c/o heartburn and right foot pain.   Patient has history of acid reflux and gout.  VS wnl per EMS report.

## 2021-09-13 ENCOUNTER — Emergency Department: Payer: Medicare Other

## 2021-09-13 ENCOUNTER — Emergency Department
Admission: EM | Admit: 2021-09-13 | Discharge: 2021-09-13 | Disposition: A | Discharge status: Home / Self Care | Payer: Medicare Other | Attending: Emergency Medicine | Admitting: Emergency Medicine

## 2021-09-13 DIAGNOSIS — R1012 Left upper quadrant pain: Secondary | ICD-10-CM

## 2021-09-13 DIAGNOSIS — K2970 Gastritis, unspecified, without bleeding: Secondary | ICD-10-CM

## 2021-09-13 DIAGNOSIS — M109 Gout, unspecified: Secondary | ICD-10-CM

## 2021-09-13 HISTORY — DX: Gout, unspecified: M10.9

## 2021-09-13 LAB — URINALYSIS, COMPLETE (UACMP) WITH MICROSCOPIC
Bilirubin Urine: NEGATIVE
Glucose, UA: NEGATIVE mg/dL
Ketones, ur: NEGATIVE mg/dL
Nitrite: NEGATIVE
Protein, ur: >300 mg/dL — AB
Specific Gravity, Urine: 1.02 (ref 1.005–1.030)
Squamous Epithelial / HPF: NONE SEEN (ref 0–5)
pH: 5.5 (ref 5.0–8.0)

## 2021-09-13 MED ORDER — SUCRALFATE 1 G PO TABS: 1.0000 g | ORAL_TABLET | Freq: Four times a day (QID) | ORAL | 1 refills | Status: DC

## 2021-09-13 MED ORDER — TRAMADOL HCL 50 MG PO TABS: 50.0000 mg | ORAL_TABLET | Freq: Four times a day (QID) | ORAL | 0 refills | Status: AC | PRN

## 2021-09-13 MED ORDER — PREDNISONE 20 MG PO TABS: ORAL_TABLET | ORAL | 0 refills | Status: AC

## 2021-09-13 MED ORDER — PANTOPRAZOLE SODIUM 40 MG PO TBEC: 40.0000 mg | DELAYED_RELEASE_TABLET | Freq: Every day | ORAL | 1 refills | Status: DC

## 2021-09-13 MED ORDER — WALKER MISC: 1.0000 | Freq: Every day | 0 refills | Status: AC | PRN

## 2021-09-13 NOTE — ED Provider Notes (Signed)
[facility] Emergency Department Provider Note ____________________________________________   Event Date/Time   First MD Initiated Contact with Patient 09/13/21 [phone]     (approximate)  I have reviewed the triage vital signs and the nursing notes.   HISTORY  Chief Complaint Abdominal Pain    HPI Jennifer Wood is a 70 y.o. female with PMH as noted below including MGUS, hypertension, diabetes, and possible schizophrenia who presents with 2 primary complaints, left upper abdominal pain and right foot pain.  The upper abdominal pain has been occurring on and off for the last year.  She previously was taking pantoprazole and then discontinued it.  She states that the pain has been somewhat worse over the last month and so she started taking the medicine again with minimal relief.  The pain is intermittent, mainly in the left upper abdomen and is worse after eating.  She has no associated nausea or vomiting.  She states her bowel movements are normal.  She has no dysuria or frequency.  She has no fever or chills.  The right foot pain has been present for the last 3 days and is associated with swelling.  She denies any trauma.  She states it feels similar to when she had gout, which she previously has had in the other foot.  Past Medical History:  Diagnosis Date   Anemia    Cataract    right eye getting it fixed 06/11/2021   Depression    Diabetes mellitus without complication ([place])    Gout    Hypercholesteremia    Hypertension    Monoclonal gammopathy    Schizophrenia ([place])    pt denies this dx    Patient Active Problem List   Diagnosis Date Noted   B12 deficiency 02/07/2021   MGUS (monoclonal gammopathy of unknown significance) 11/05/2017   Schizophrenia ([place]) 03/09/2017   Hypertension 03/09/2017   Diabetes ([place]) 03/09/2017   Depression 03/09/2017    Past Surgical History:  Procedure Laterality Date   CATARACT EXTRACTION W/PHACO Left 03/11/2016    Procedure: CATARACT EXTRACTION PHACO AND INTRAOCULAR LENS PLACEMENT ([place]);  Surgeon: Leandrew Koyanagi, MD;  Location: [place];  Service: Ophthalmology;  Laterality: Left;  DIABETIC - insulin   ORIF TIBIA PLATEAU Right 09/05/06   [facility]   TUBAL LIGATION      Prior to Admission medications   Medication Sig Start Date End Date Taking? Authorizing Provider  pantoprazole (PROTONIX) 40 MG tablet Take 1 tablet (40 mg total) by mouth daily. 09/13/21 11/12/21 Yes Arta Silence, MD  predniSONE (DELTASONE) 20 MG tablet Take 2 tablets (40 mg total) by mouth daily with breakfast for 5 days, THEN 1 tablet (20 mg total) daily with breakfast for 5 days. 09/13/21 09/23/21 Yes Arta Silence, MD  sucralfate (CARAFATE) 1 g tablet Take 1 tablet (1 g total) by mouth 4 (four) times daily. 09/13/21 11/12/21 Yes Arta Silence, MD  traMADol (ULTRAM) 50 MG tablet Take 1 tablet (50 mg total) by mouth every 6 (six) hours as needed for severe pain. 09/13/21 09/13/22 Yes Arta Silence, MD    Allergies Patient has no active allergies.  Family History  Problem Relation Age of Onset   Diabetes Sister    Breast cancer Neg Hx     Social History Social History   Tobacco Use   Smoking status: Former    Packs/day: 0.25    Years: 5.00    Pack years: 1.25    Types: Cigarettes   Smokeless tobacco: Former  Types: Snuff  Vaping Use   Vaping Use: Never used  Substance Use Topics   Alcohol use: No   Drug use: No    Review of Systems  Constitutional: No fever/chills Eyes: No visual changes. ENT: No sore throat. Cardiovascular: Denies chest pain. Respiratory: Denies shortness of breath. Gastrointestinal: No vomiting or diarrhea.  Genitourinary: Negative for dysuria.  Musculoskeletal: Negative for back pain.  Positive for right foot pain. Skin: Negative for rash. Neurological: Negative for headaches, focal weakness or  numbness.   ____________________________________________   PHYSICAL EXAM:  VITAL SIGNS: ED Triage Vitals  Enc Vitals Group     BP 09/12/21 1604 (!) 168/67     Pulse Rate 09/12/21 1604 78     Resp 09/12/21 1604 20     Temp 09/12/21 1604 98.7 F (37.1 C)     Temp Source 09/12/21 1604 Oral     SpO2 09/12/21 1604 96 %     Weight 09/12/21 1604 175 lb (79.4 kg)     Height 09/12/21 1604 5\' 5"  (1.651 m)     Head Circumference --      Peak Flow --      Pain Score 09/12/21 1618 5     Pain Loc --      Pain Edu? --      Excl. in [place]? --     Constitutional: Alert and oriented. Well appearing and in no acute distress. Eyes: Conjunctivae are normal.  No scleral icterus. Head: Atraumatic. Nose: No congestion/rhinnorhea. Mouth/Throat: Mucous membranes are moist.   Neck: Normal range of motion.  Cardiovascular: Normal rate, regular rhythm. Good peripheral circulation. Respiratory: Normal respiratory effort.  No retractions.  Gastrointestinal: Soft with mild left upper quadrant discomfort.  No distention.  Genitourinary: No flank tenderness. Musculoskeletal: No lower extremity edema.  Extremities warm and well perfused.  Right foot with swelling and slight warmth to dorsum.  No erythema, induration, or rash.  2+ DP pulse.  Full range of motion of the ankle. Neurologic:  Normal speech and language. No gross focal neurologic deficits are appreciated.  Skin:  Skin is warm and dry. No rash noted. Psychiatric: Mood and affect are normal. Speech and behavior are normal.  ____________________________________________   LABS (all labs ordered are listed, but only abnormal results are displayed)  Labs Reviewed  COMPREHENSIVE METABOLIC PANEL - Abnormal; Notable for the following components:      Result Value   Glucose, Bld 183 (*)    BUN 42 (*)    Creatinine, Ser 1.96 (*)    Albumin 3.1 (*)    AST 13 (*)    GFR, Estimated 27 (*)    All other components within normal limits  CBC - Abnormal;  Notable for the following components:   RBC 3.17 (*)    Hemoglobin 10.0 (*)    HCT 28.5 (*)    All other components within normal limits  URINALYSIS, COMPLETE (UACMP) WITH MICROSCOPIC - Abnormal; Notable for the following components:   APPearance CLOUDY (*)    Hgb urine dipstick TRACE (*)    Protein, ur >300 (*)    Leukocytes,Ua TRACE (*)    Bacteria, UA RARE (*)    All other components within normal limits  LIPASE, BLOOD  TROPONIN I (HIGH SENSITIVITY)  TROPONIN I (HIGH SENSITIVITY)   ____________________________________________  EKG  ED ECG REPORT I, Arta Silence, the attending physician, personally viewed and interpreted this ECG.  Date: 09/12/2021 EKG Time: 1622 Rate: 74 Rhythm: normal sinus rhythm QRS Axis:  normal Intervals: RBBB ST/T Wave abnormalities: normal Narrative Interpretation: no evidence of acute ischemia  ____________________________________________  RADIOLOGY  Chest x-ray interpreted by me shows no focal consolidation or edema XR R foot: Soft tissue swelling with no acute fracture CT abdomen/pelvis:  IMPRESSION:  1. Multiple small pulmonary nodules at the lung bases that were not  seen in 2018. Metastatic disease is the primary concern, although  atypical infection/granulomatous disease is also possible. Recommend  chest CT and correlation for primary malignancy.  2. No specific explanation for abdominal pain.  3. Atherosclerosis with extensive coronary calcification.      CT chest: IMPRESSION:  1. Bilateral pulmonary nodules some with indistinct margins.  Differential includes a nodular inflammatory / infectious process  versus metastatic disease. Recommend timely pulmonology  consultation.  2. No mediastinal lymphadenopathy.  3. Coronary artery calcification and aortic atherosclerotic  calcification.    ____________________________________________   PROCEDURES  Procedure(s) performed: No  Procedures  Critical Care performed:  No ____________________________________________   INITIAL IMPRESSION / ASSESSMENT AND PLAN / ED COURSE  Pertinent labs & imaging results that were available during my care of the patient were reviewed by me and considered in my medical decision making (see chart for details).   70 year old female with PMH as noted above including MGUS, hypertension, diabetes, and possible schizophrenia presents with chronic intermittent left upper quadrant pain as well as acute right foot pain over the last 3 days.  Her vital signs are normal and the physical exam is unremarkable except for swelling and tenderness to the right foot and some mild discomfort on palpation of the left upper abdomen.  Upper abdominal pain: This is most consistent with gastritis, PUD, GERD.  Lab work-up obtained from triage is unremarkable except for chronic findings of anemia and elevated creatinine.  Chest and right foot x-rays are negative.  I will obtain a CT of the abdomen to rule out more nefarious etiologies of the abdominal pain.  If this is negative we will discharge home with continued pantoprazole and I will add Carafate and referred to GI.  Right foot pain: Most consistent with gout.  There is no erythema or evidence of cellulitis or septic arthritis.  The patient's WBC count is normal.  There is no pain going up the leg.  X-rays are negative.  We will avoid NSAIDs in this patient with chronic kidney disease and treat with steroid and tramadol.  ----------------------------------------- 12:18 PM on 09/13/2021 -----------------------------------------  CT abdomen shows no significant intra-abdominal findings.  There were pulmonary nodules identified which I followed up with a CT of the chest.  This shows multiple small pulmonary nodules.  Differential is inflammatory/infectious versus possible metastatic disease.  The patient has no fever, elevated WBC count, or respiratory symptoms at this time so there is no clinical  evidence of an infectious etiology.  She has now been in the ED for over 20 hours with no significant symptoms and is stable for discharge home.  I counseled her extensively on the results of the work-up and the plan of care.  I gave her thorough return precautions and she expressed understanding.  For the abdominal pain, likely gastritis, I have prescribed continue pantoprazole, Carafate, and given GI referral.  For the foot pain, likely gout, I have prescribed prednisone and tramadol.  I have also provided pulmonology referral and informed the patient specifically about the CT findings and the need for prompt pulmonary/primary care follow-up.   ____________________________________________   FINAL CLINICAL IMPRESSION(S) / ED DIAGNOSES  Final diagnoses:  Gastritis without bleeding, unspecified chronicity, unspecified gastritis type  Left upper quadrant abdominal pain  Acute gout of right foot, unspecified cause      NEW MEDICATIONS STARTED DURING THIS VISIT:  New Prescriptions   PANTOPRAZOLE (PROTONIX) 40 MG TABLET    Take 1 tablet (40 mg total) by mouth daily.   PREDNISONE (DELTASONE) 20 MG TABLET    Take 2 tablets (40 mg total) by mouth daily with breakfast for 5 days, THEN 1 tablet (20 mg total) daily with breakfast for 5 days.   SUCRALFATE (CARAFATE) 1 G TABLET    Take 1 tablet (1 g total) by mouth 4 (four) times daily.   TRAMADOL (ULTRAM) 50 MG TABLET    Take 1 tablet (50 mg total) by mouth every 6 (six) hours as needed for severe pain.     Note:  This document was prepared using Dragon voice recognition software and may include unintentional dictation errors.    Arta Silence, MD 09/13/21 1220

## 2021-09-13 NOTE — ED Notes (Signed)
Brought to room 47 via wheelchair.  Says upper stomach pain on and off for quiet a while.  Says it has been happening since she got b12 shots.  She is in no distress. Has gout in feet and has pain there as well.

## 2021-09-13 NOTE — Discharge Instructions (Addendum)
You have 4 prescriptions.  Continue to take the pantoprazole daily for the next month or until you follow-up with a GI doctor.  This will help with decreasing stomach acid.  Take the Carafate up to 4 times daily as needed for abdominal pain.  Take the prednisone as prescribed (2 tablets daily for the first 5 days then 1 tablet daily for the next 5 days) to decrease inflammation and swelling in your foot.  You may take the tramadol if needed for continued pain in the foot.  Your CT scans showed nodules in the lungs.  These need to be followed by a pulmonary doctor (a lung specialist) we have made a referral.  You should also follow-up with a GI doctor and referral information has been provided as well.  Return to the ER for new, worsening, or persistent severe abdominal pain, vomiting, fever, weakness, foot pain, swelling, redness, or any other new or worsening symptoms that concern you.

## 2021-09-13 NOTE — ED Notes (Signed)
Called Adonis Huguenin daughter to u pdate.

## 2021-09-29 ENCOUNTER — Other Ambulatory Visit: Payer: Self-pay | Admitting: Primary Care

## 2021-09-29 DIAGNOSIS — Z1231 Encounter for screening mammogram for malignant neoplasm of breast: Secondary | ICD-10-CM

## 2021-10-22 ENCOUNTER — Encounter: Payer: Self-pay | Admitting: Primary Care

## 2021-10-22 ENCOUNTER — Other Ambulatory Visit: Payer: Self-pay

## 2021-10-22 ENCOUNTER — Ambulatory Visit
Admission: RE | Admit: 2021-10-22 | Discharge: 2021-10-22 | Disposition: A | Discharge status: Home / Self Care | Payer: Medicare Other | Source: Ambulatory Visit | Attending: Primary Care | Admitting: Primary Care

## 2021-10-22 DIAGNOSIS — Z1231 Encounter for screening mammogram for malignant neoplasm of breast: Secondary | ICD-10-CM | POA: Insufficient documentation

## 2021-11-07 ENCOUNTER — Inpatient Hospital Stay: Payer: Medicare Other | Attending: Oncology

## 2021-11-07 ENCOUNTER — Inpatient Hospital Stay (HOSPITAL_BASED_OUTPATIENT_CLINIC_OR_DEPARTMENT_OTHER): Payer: Medicare Other | Admitting: Oncology

## 2021-11-07 ENCOUNTER — Encounter: Payer: Self-pay | Admitting: Oncology

## 2021-11-07 ENCOUNTER — Other Ambulatory Visit: Payer: Self-pay

## 2021-11-07 VITALS — BP 162/67 | HR 73 | Temp 97.7°F | Resp 18 | Wt 173.5 lb

## 2021-11-07 DIAGNOSIS — N183 Chronic kidney disease, stage 3 unspecified: Secondary | ICD-10-CM

## 2021-11-07 DIAGNOSIS — I129 Hypertensive chronic kidney disease with stage 1 through stage 4 chronic kidney disease, or unspecified chronic kidney disease: Secondary | ICD-10-CM | POA: Insufficient documentation

## 2021-11-07 DIAGNOSIS — R12 Heartburn: Secondary | ICD-10-CM | POA: Insufficient documentation

## 2021-11-07 DIAGNOSIS — D631 Anemia in chronic kidney disease: Secondary | ICD-10-CM | POA: Insufficient documentation

## 2021-11-07 DIAGNOSIS — D472 Monoclonal gammopathy: Secondary | ICD-10-CM | POA: Insufficient documentation

## 2021-11-07 DIAGNOSIS — N189 Chronic kidney disease, unspecified: Secondary | ICD-10-CM | POA: Insufficient documentation

## 2021-11-07 DIAGNOSIS — R7402 Elevation of levels of lactic acid dehydrogenase (LDH): Secondary | ICD-10-CM | POA: Diagnosis not present

## 2021-11-07 DIAGNOSIS — R768 Other specified abnormal immunological findings in serum: Secondary | ICD-10-CM | POA: Diagnosis not present

## 2021-11-07 DIAGNOSIS — R5383 Other fatigue: Secondary | ICD-10-CM | POA: Diagnosis not present

## 2021-11-07 DIAGNOSIS — Z833 Family history of diabetes mellitus: Secondary | ICD-10-CM | POA: Insufficient documentation

## 2021-11-07 DIAGNOSIS — D638 Anemia in other chronic diseases classified elsewhere: Secondary | ICD-10-CM

## 2021-11-07 DIAGNOSIS — Z87891 Personal history of nicotine dependence: Secondary | ICD-10-CM | POA: Diagnosis not present

## 2021-11-07 DIAGNOSIS — Z79899 Other long term (current) drug therapy: Secondary | ICD-10-CM | POA: Insufficient documentation

## 2021-11-07 DIAGNOSIS — Z862 Personal history of diseases of the blood and blood-forming organs and certain disorders involving the immune mechanism: Secondary | ICD-10-CM

## 2021-11-07 LAB — CBC WITH DIFFERENTIAL/PLATELET
Abs Immature Granulocytes: 0.01 10*3/uL (ref 0.00–0.07)
Basophils Absolute: 0.1 10*3/uL (ref 0.0–0.1)
Basophils Relative: 1 %
Eosinophils Absolute: 0.2 10*3/uL (ref 0.0–0.5)
Eosinophils Relative: 3 %
HCT: 29.1 % — ABNORMAL LOW (ref 36.0–46.0)
Hemoglobin: 9.7 g/dL — ABNORMAL LOW (ref 12.0–15.0)
Immature Granulocytes: 0 %
Lymphocytes Relative: 28 %
Lymphs Abs: 2 10*3/uL (ref 0.7–4.0)
MCH: 29.7 pg (ref 26.0–34.0)
MCHC: 33.3 g/dL (ref 30.0–36.0)
MCV: 89 fL (ref 80.0–100.0)
Monocytes Absolute: 0.5 10*3/uL (ref 0.1–1.0)
Monocytes Relative: 7 %
Neutro Abs: 4.3 10*3/uL (ref 1.7–7.7)
Neutrophils Relative %: 61 %
Platelets: 282 10*3/uL (ref 150–400)
RBC: 3.27 MIL/uL — ABNORMAL LOW (ref 3.87–5.11)
RDW: 13.7 % (ref 11.5–15.5)
WBC: 7 10*3/uL (ref 4.0–10.5)
nRBC: 0 % (ref 0.0–0.2)

## 2021-11-07 LAB — IRON AND TIBC
Iron: 66 ug/dL (ref 28–170)
Saturation Ratios: 27 % (ref 10.4–31.8)
TIBC: 249 ug/dL — ABNORMAL LOW (ref 250–450)
UIBC: 183 ug/dL

## 2021-11-07 LAB — FERRITIN: Ferritin: 94 ng/mL (ref 11–307)

## 2021-11-07 NOTE — Progress Notes (Signed)
Hematology/Oncology Consult note [facility]  Telephone:([phone] [phone]  Patient Care Team: Ellamae Sia, MD (Inactive) as PCP - General (Internal Medicine)   Name of the patient: Jennifer Wood  [id number]  09-05-1951   Date of visit: 11/07/21  Diagnosis-  normocytic anemia: Likely multifactorial secondary to B12 deficiency and anemia of chronic kidney disease.    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  She has now been referred to reestablish care.   Results of bloodwork from 03/09/2017 were as follows: CBC showed white count of 9.6, H&H of 10.3/29.9 and a platelet count of 301. CMP was significant for BUN of 39 and creatinine of 1.56. Reticulocyte count was 1.4% and low for the degree of anemia. Ferritin was normal at 51 and iron studies are within normal limits. B12 and folate were within normal limits. TSH was normal at 1.49. Haptoglobin was normal at 187. ESR was elevated at 70. Multiple myeloma panel showed elevated IgG of 1852. Serum monoclonal protein of 0.7 g was noted. Lambda light chain specificity. LDH was normal at 135 and beta-2 microglobulin was elevated at 4.2. 24-hour urine protein revealed 1gm proteinuria and 39 mg monoclonal protein in 24 hours.    Bone marrow biopsy on 04/20/17 showed normocellular marrow with trilineage hematopoiesis and mild increase in plasma cells 9% (5-10%)   Most recent labs from 12/16/2020 showed white count of 7.5, H&H of 10.6/31.9 with a platelet count of 288.  Serum creatinine was elevated at 1.5.  Serum calcium was normal at 9.4 and total protein normal at 7.1.  M spike was 0.9 on serum protein electrophoresis.    Interval history-patient reports baseline fatigue.  She reports ongoing  symptoms of heartburn.  Denies any chest pain, shortness of breath  ECOG PS- 1 Pain scale- 3   Review of systems- Review of Systems  Constitutional:  Positive for malaise/fatigue. Negative for chills, fever and weight loss.  HENT:  Negative for congestion, ear discharge and nosebleeds.   Eyes:  Negative for blurred vision.  Respiratory:  Negative for cough, hemoptysis, sputum production, shortness of breath and wheezing.   Cardiovascular:  Negative for chest pain, palpitations, orthopnea and claudication.  Gastrointestinal:  Positive for heartburn. Negative for abdominal pain, blood in stool, constipation, diarrhea, melena, nausea and vomiting.  Genitourinary:  Negative for dysuria, flank pain, frequency, hematuria and urgency.  Musculoskeletal:  Negative for back pain, joint pain and myalgias.  Skin:  Negative for rash.  Neurological:  Negative for dizziness, tingling, focal weakness, seizures, weakness and headaches.  Endo/Heme/Allergies:  Does not bruise/bleed easily.  Psychiatric/Behavioral:  Negative for depression and suicidal ideas. The patient does not have insomnia.      Allergies  Allergen Reactions   Ziprasidone Hcl Other (See Comments)     Past Medical History:  Diagnosis Date   Anemia    Cataract    right eye getting it fixed 06/11/2021   Depression    Diabetes mellitus without complication ([place])    Gout    Hypercholesteremia    Hypertension    Monoclonal gammopathy    Schizophrenia ([place])    pt denies this dx     Past Surgical History:  Procedure Laterality Date   CATARACT EXTRACTION [facility] Left 03/11/2016  Procedure: CATARACT EXTRACTION PHACO AND INTRAOCULAR LENS PLACEMENT (IOC);  Surgeon: Leandrew Koyanagi, MD;  Location: [place];  Service: Ophthalmology;  Laterality: Left;  DIABETIC - insulin   ORIF TIBIA PLATEAU Right 09/05/06   [facility]   TUBAL LIGATION      Social History   Socioeconomic History   Marital status: Divorced    Spouse  name: Not on file   Number of children: Not on file   Years of education: Not on file   Highest education level: Not on file  Occupational History   Not on file  Tobacco Use   Smoking status: Former    Packs/day: 0.25    Years: 5.00    Pack years: 1.25    Types: Cigarettes   Smokeless tobacco: Former    Types: Snuff  Vaping Use   Vaping Use: Never used  Substance and Sexual Activity   Alcohol use: No   Drug use: No   Sexual activity: Never  Other Topics Concern   Not on file  Social History Narrative   Not on file   Social Determinants of Health   Financial Resource Strain: Not on file  Food Insecurity: Not on file  Transportation Needs: Not on file  Physical Activity: Not on file  Stress: Not on file  Social Connections: Not on file  Intimate Partner Violence: Not on file    Family History  Problem Relation Age of Onset   Diabetes Sister    Breast cancer Neg Hx      Current Outpatient Medications:    ACCU-CHEK AVIVA PLUS test strip, 1 each daily., Disp: , Rfl:    metFORMIN (GLUCOPHAGE-XR) 500 MG 24 hr tablet, Take 500 mg by mouth 2 (two) times daily., Disp: , Rfl:    Misc. Devices (WALKER) MISC, 1 each by Does not apply route daily as needed., Disp: 1 each, Rfl: 0   pantoprazole (PROTONIX) 40 MG tablet, Take 1 tablet (40 mg total) by mouth daily., Disp: 30 tablet, Rfl: 1   aspirin 81 MG chewable tablet, Chew by mouth. (Patient not taking: Reported on 11/07/2021), Disp: , Rfl:    JARDIANCE 10 MG TABS tablet, Take 10 mg by mouth daily. (Patient not taking: Reported on 11/07/2021), Disp: , Rfl:    lisinopril (ZESTRIL) 2.5 MG tablet, Take 2.5 mg by mouth daily. (Patient not taking: Reported on 11/07/2021), Disp: , Rfl:    pantoprazole (PROTONIX) 20 MG tablet, Take 20 mg by mouth daily. (Patient not taking: Reported on 11/07/2021), Disp: , Rfl:    sucralfate (CARAFATE) 1 g tablet, Take 1 tablet (1 g total) by mouth 4 (four) times daily. (Patient not taking: Reported on  11/07/2021), Disp: 120 tablet, Rfl: 1   traMADol (ULTRAM) 50 MG tablet, Take 1 tablet (50 mg total) by mouth every 6 (six) hours as needed for severe pain. (Patient not taking: Reported on 11/07/2021), Disp: 15 tablet, Rfl: 0  Physical exam:  Vitals:   11/07/21 0956  BP: (!) 162/67  Pulse: 73  Resp: 18  Temp: 97.7 F (36.5 C)  SpO2: 100%  Weight: 173 lb 8 oz (78.7 kg)   Physical Exam Constitutional:      General: She is not in acute distress. Cardiovascular:     Rate and Rhythm: Normal rate and regular rhythm.     Heart sounds: Normal heart sounds.  Pulmonary:     Effort: Pulmonary effort is normal.     Breath sounds: Normal breath sounds.  Abdominal:  General: Bowel sounds are normal.     Palpations: Abdomen is soft.  Skin:    General: Skin is warm and dry.  Neurological:     Mental Status: She is alert and oriented to person, place, and time.     CMP Latest Ref Rng & Units 09/12/2021  Glucose 70 - 99 mg/dL 183(H)  BUN 8 - 23 mg/dL 42(H)  Creatinine 0.44 - 1.00 mg/dL 1.96(H)  Sodium 135 - 145 mmol/L 135  Potassium 3.5 - 5.1 mmol/L 4.4  Chloride 98 - 111 mmol/L 101  CO2 22 - 32 mmol/L 27  Calcium 8.9 - 10.3 mg/dL 8.9  Total Protein 6.5 - 8.1 g/dL 7.6  Total Bilirubin 0.3 - 1.2 mg/dL 0.8  Alkaline Phos 38 - 126 U/L 62  AST 15 - 41 U/L 13(L)  ALT 0 - 44 U/L 9   CBC Latest Ref Rng & Units 11/07/2021  WBC 4.0 - 10.5 K/uL 7.0  Hemoglobin 12.0 - 15.0 g/dL 9.7(L)  Hematocrit 36.0 - 46.0 % 29.1(L)  Platelets 150 - 400 K/uL 282    No images are attached to the encounter.  MM 3D SCREEN BREAST BILATERAL  Result Date: 10/24/2021 CLINICAL DATA:  Screening. EXAM: DIGITAL SCREENING BILATERAL MAMMOGRAM WITH TOMOSYNTHESIS AND CAD TECHNIQUE: Bilateral screening digital craniocaudal and mediolateral oblique mammograms were obtained. Bilateral screening digital breast tomosynthesis was performed. The images were evaluated with computer-aided detection. COMPARISON:  Previous  exam(s). ACR Breast Density Category b: There are scattered areas of fibroglandular density. FINDINGS: There are no findings suspicious for malignancy. IMPRESSION: No mammographic evidence of malignancy. A result letter of this screening mammogram will be mailed directly to the patient. RECOMMENDATION: Screening mammogram in one year. (Code:SM-B-01Y) BI-RADS CATEGORY  1: Negative. Electronically Signed   By: Audie Pinto M.D.   On: 10/24/2021 09:42    Assessment and plan- Patient is a 70 y.o. female here for follow-up of anemia of chronic kidney disease and MGUS  Anemia of chronic kidney disease: Overall stable between 9.5-10.5.Iron studies are normal and she does not require any IV iron at this time.  Low TIBC indicative of anemia of chronic disease.  She does not require to start EPO at this time.  Given the stability of her counts I will repeat CBC ferritin iron studies in 6 months in 1 year and see her back in a year  IgG MGUS: Myeloma panel and serum free light chains currently pending.  In the past she was found to have a small IgG kappa paraprotein in her SPEP.  Kappa and lambda light chains were elevated with a normal free light chain ratio likely secondary to chronic kidney disease  Chronic kidney disease: Will need referral to nephrology.  Patient states that she does not have a nephrologist   Visit Diagnosis 1. History of anemia due to chronic kidney disease   2. Anemia of chronic disease      Dr. Randa Evens, MD, MPH [facility] [phone] 11/07/2021 2:16 PM

## 2021-11-07 NOTE — Progress Notes (Signed)
Pt states she is having pain on the rt side of her face that started about a week ago Two days ago it felt like she had a small bump on her eye, she can still see out of it but it feels sore. Pain level 8/10. Also, she states that since she has been on "prontonix" her BP has increased.

## 2021-11-10 LAB — KAPPA/LAMBDA LIGHT CHAINS
Kappa free light chain: 93.3 mg/L — ABNORMAL HIGH (ref 3.3–19.4)
Kappa, lambda light chain ratio: 1.07 (ref 0.26–1.65)
Lambda free light chains: 86.8 mg/L — ABNORMAL HIGH (ref 5.7–26.3)

## 2021-11-11 LAB — MULTIPLE MYELOMA PANEL, SERUM
Albumin SerPl Elph-Mcnc: 3.3 g/dL (ref 2.9–4.4)
Albumin/Glob SerPl: 0.9 (ref 0.7–1.7)
Alpha 1: 0.2 g/dL (ref 0.0–0.4)
Alpha2 Glob SerPl Elph-Mcnc: 0.7 g/dL (ref 0.4–1.0)
B-Globulin SerPl Elph-Mcnc: 1 g/dL (ref 0.7–1.3)
Gamma Glob SerPl Elph-Mcnc: 1.7 g/dL (ref 0.4–1.8)
Globulin, Total: 3.7 g/dL (ref 2.2–3.9)
IgA: 197 mg/dL (ref 87–352)
IgG (Immunoglobin G), Serum: 1887 mg/dL — ABNORMAL HIGH (ref 586–1602)
IgM (Immunoglobulin M), Srm: 130 mg/dL (ref 26–217)
M Protein SerPl Elph-Mcnc: 0.8 g/dL — ABNORMAL HIGH
Total Protein ELP: 7 g/dL (ref 6.0–8.5)

## 2021-12-08 ENCOUNTER — Ambulatory Visit (INDEPENDENT_AMBULATORY_CARE_PROVIDER_SITE_OTHER): Payer: Medicare Other | Admitting: Gastroenterology

## 2021-12-08 ENCOUNTER — Encounter: Payer: Self-pay | Admitting: Gastroenterology

## 2021-12-08 VITALS — BP 159/63 | HR 88 | Temp 98.4°F | Ht 66.0 in | Wt 173.8 lb

## 2021-12-08 DIAGNOSIS — K219 Gastro-esophageal reflux disease without esophagitis: Secondary | ICD-10-CM | POA: Diagnosis not present

## 2021-12-08 DIAGNOSIS — R1031 Right lower quadrant pain: Secondary | ICD-10-CM

## 2021-12-08 DIAGNOSIS — Z1211 Encounter for screening for malignant neoplasm of colon: Secondary | ICD-10-CM | POA: Diagnosis not present

## 2021-12-08 DIAGNOSIS — R1032 Left lower quadrant pain: Secondary | ICD-10-CM | POA: Diagnosis not present

## 2021-12-08 MED ORDER — CLENPIQ 10-3.5-12 MG-GM -GM/160ML PO SOLN: 1.0000 | ORAL | 0 refills | Status: DC

## 2021-12-08 MED ORDER — PANTOPRAZOLE SODIUM 20 MG PO TBEC: 20.0000 mg | DELAYED_RELEASE_TABLET | Freq: Two times a day (BID) | ORAL | 2 refills | Status: DC

## 2021-12-08 NOTE — Addendum Note (Signed)
Addended by: Eliseo Squires on: 12/08/2021 04:23 PM   Modules accepted: Orders

## 2021-12-08 NOTE — Patient Instructions (Signed)
Gastroesophageal Reflux Disease, Adult Gastroesophageal reflux (GER) happens when acid from the stomach flows up into the tube that connects the mouth and the stomach (esophagus). Normally, food travels down the esophagus and stays in the stomach to be digested. With GER, food and stomach acid sometimes move back up into the esophagus. You may have a disease called gastroesophageal reflux disease (GERD) if the reflux: Happens often. Causes frequent or very bad symptoms. Causes problems such as damage to the esophagus. When this happens, the esophagus becomes sore and swollen. Over time, GERD can make small holes (ulcers) in the lining of the esophagus. What are the causes? This condition is caused by a problem with the muscle between the esophagus and the stomach. When this muscle is weak or not normal, it does not close properly to keep food and acid from coming back up from the stomach. The muscle can be weak because of: Tobacco use. Pregnancy. Having a certain type of hernia (hiatal hernia). Alcohol use. Certain foods and drinks, such as coffee, chocolate, onions, and peppermint. What increases the risk? Being overweight. Having a disease that affects your connective tissue. Taking NSAIDs, such a ibuprofen. What are the signs or symptoms? Heartburn. Difficult or painful swallowing. The feeling of having a lump in the throat. A bitter taste in the mouth. Bad breath. Having a lot of saliva. Having an upset or bloated stomach. Burping. Chest pain. Different conditions can cause chest pain. Make sure you see your doctor if you have chest pain. Shortness of breath or wheezing. A long-term cough or a cough at night. Wearing away of the surface of teeth (tooth enamel). Weight loss. How is this treated? Making changes to your diet. Taking medicine. Having surgery. Treatment will depend on how bad your symptoms are. Follow these instructions at home: Eating and drinking  Follow a  diet as told by your doctor. You may need to avoid foods and drinks such as: Coffee and tea, with or without caffeine. Drinks that contain alcohol. Energy drinks and sports drinks. Bubbly (carbonated) drinks or sodas. Chocolate and cocoa. Peppermint and mint flavorings. Garlic and onions. Horseradish. Spicy and acidic foods. These include peppers, chili powder, curry powder, vinegar, hot sauces, and BBQ sauce. Citrus fruit juices and citrus fruits, such as oranges, lemons, and limes. Tomato-based foods. These include red sauce, chili, salsa, and pizza with red sauce. Fried and fatty foods. These include donuts, french fries, potato chips, and high-fat dressings. High-fat meats. These include hot dogs, rib eye steak, sausage, ham, and bacon. High-fat dairy items, such as whole milk, butter, and cream cheese. Eat small meals often. Avoid eating large meals. Avoid drinking large amounts of liquid with your meals. Avoid eating meals during the 2-3 hours before bedtime. Avoid lying down right after you eat. Do not exercise right after you eat. Lifestyle  Do not smoke or use any products that contain nicotine or tobacco. If you need help quitting, ask your doctor. Try to lower your stress. If you need help doing this, ask your doctor. If you are overweight, lose an amount of weight that is healthy for you. Ask your doctor about a safe weight loss goal. General instructions Pay attention to any changes in your symptoms. Take over-the-counter and prescription medicines only as told by your doctor. Do not take aspirin, ibuprofen, or other NSAIDs unless your doctor says it is okay. Wear loose clothes. Do not wear anything tight around your waist. Raise (elevate) the head of your bed about 6  inches (15 cm). You may need to use a wedge to do this. Avoid bending over if this makes your symptoms worse. Keep all follow-up visits. Contact a doctor if: You have new symptoms. You lose weight and you  do not know why. You have trouble swallowing or it hurts to swallow. You have wheezing or a cough that keeps happening. You have a hoarse voice. Your symptoms do not get better with treatment. Get help right away if: You have sudden pain in your arms, neck, jaw, teeth, or back. You suddenly feel sweaty, dizzy, or light-headed. You have chest pain or shortness of breath. You vomit and the vomit is green, yellow, or black, or it looks like blood or coffee grounds. You faint. Your poop (stool) is red, bloody, or black. You cannot swallow, drink, or eat. These symptoms may represent a serious problem that is an emergency. Do not wait to see if the symptoms will go away. Get medical help right away. Call your local emergency services (911 in the [place] Do not drive yourself to the hospital. Summary If a person has gastroesophageal reflux disease (GERD), food and stomach acid move back up into the esophagus and cause symptoms or problems such as damage to the esophagus. Treatment will depend on how bad your symptoms are. Follow a diet as told by your doctor. Take all medicines only as told by your doctor. This information is not intended to replace advice given to you by your health care provider. Make sure you discuss any questions you have with your health care provider. Document Revised: 06/24/2020 Document Reviewed: 06/24/2020 Elsevier Patient Education  [place].

## 2021-12-08 NOTE — Progress Notes (Signed)
Cephas Darby, MD [address]  [place], [place] [address]  Main: [phone]  Fax: [phone]    Gastroenterology Consultation  Referring Provider:     Freddy Finner, NP Primary Care Physician:  Ellamae Sia, MD (Inactive) Primary Gastroenterologist:  Dr. Cephas Darby Reason for Consultation:     Chronic GERD        HPI:   Jennifer Wood is a 69 y.o. female referred by Dr. Quay Burow, Ala Dach, MD (Inactive)  for consultation & management of chronic GERD.  Patient reports that for last 1 year, she has been experiencing reflux, heartburn, regurgitation.  This started after she was treated for flareup of her gout with prednisone about an year ago.  She is currently on Protonix 20 mg daily.  Patient does report lower abdominal discomfort, gurgling.  She denies rectal bleeding, diarrhea or constipation.  She denies any weight loss.  She does have history of diabetes.  Patient denies difficulty swallowing.  She has anemia of chronic kidney disease  NSAIDs: None  Antiplts/Anticoagulants/Anti thrombotics: None  GI Procedures: None  Past Medical History:  Diagnosis Date   Anemia    Cataract    right eye getting it fixed 06/11/2021   Depression    Diabetes mellitus without complication ([place])    Gout    Hypercholesteremia    Hypertension    Monoclonal gammopathy    Schizophrenia ([place])    pt denies this dx    Past Surgical History:  Procedure Laterality Date   CATARACT EXTRACTION W/PHACO Left 03/11/2016   Procedure: CATARACT EXTRACTION PHACO AND INTRAOCULAR LENS PLACEMENT ([place]);  Surgeon: Leandrew Koyanagi, MD;  Location: [place];  Service: Ophthalmology;  Laterality: Left;  DIABETIC - insulin   ORIF TIBIA PLATEAU Right 09/05/06   [facility]   TUBAL LIGATION      Current Outpatient Medications:    ACCU-CHEK AVIVA PLUS test strip, 1 each daily., Disp: , Rfl:    JARDIANCE 10 MG TABS tablet, Take 10 mg by mouth daily., Disp: , Rfl:    lisinopril  (ZESTRIL) 2.5 MG tablet, Take 2.5 mg by mouth daily., Disp: , Rfl:    Misc. Devices (WALKER) MISC, 1 each by Does not apply route daily as needed., Disp: 1 each, Rfl: 0   Sod Picosulfate-Mag Ox-Cit Acd (CLENPIQ) 10-3.5-12 MG-GM -GM/160ML SOLN, Take 1 kit by mouth as directed. At 5 PM evening before procedure, drink 1 bottle of Clenpiq, hydrate, drink (5) 8 oz of water. Then do the same thing 5 hours prior to your procedure., Disp: 320 mL, Rfl: 0   aspirin 81 MG chewable tablet, Chew by mouth. (Patient not taking: Reported on 11/07/2021), Disp: , Rfl:    metFORMIN (GLUCOPHAGE-XR) 500 MG 24 hr tablet, Take 500 mg by mouth 2 (two) times daily. (Patient not taking: Reported on 12/08/2021), Disp: , Rfl:    pantoprazole (PROTONIX) 20 MG tablet, Take 20 mg by mouth daily. (Patient not taking: Reported on 11/07/2021), Disp: , Rfl:    pantoprazole (PROTONIX) 40 MG tablet, Take 1 tablet (40 mg total) by mouth daily., Disp: 30 tablet, Rfl: 1   sucralfate (CARAFATE) 1 g tablet, Take 1 tablet (1 g total) by mouth 4 (four) times daily. (Patient not taking: Reported on 11/07/2021), Disp: 120 tablet, Rfl: 1   traMADol (ULTRAM) 50 MG tablet, Take 1 tablet (50 mg total) by mouth every 6 (six) hours as needed for severe pain. (Patient not taking: Reported on 11/07/2021),  Disp: 15 tablet, Rfl: 0    Family History  Problem Relation Age of Onset   Diabetes Sister    Breast cancer Neg Hx      Social History   Tobacco Use   Smoking status: Former    Packs/day: 0.25    Years: 5.00    Pack years: 1.25    Types: Cigarettes   Smokeless tobacco: Former    Types: Snuff  Vaping Use   Vaping Use: Never used  Substance Use Topics   Alcohol use: No   Drug use: No    Allergies as of 12/08/2021 - Review Complete 12/08/2021  Allergen Reaction Noted   Ziprasidone hcl Other (See Comments) 08/05/2020    Review of Systems:    All systems reviewed and negative except where noted in HPI.   Physical Exam:  BP (!)  159/63 (BP Location: Left Arm, Patient Position: Sitting, Cuff Size: Normal)   Pulse 88   Temp 98.4 F (36.9 C) (Oral)   Ht _0  (1.676 m)   Wt 173 lb 12.8 oz (78.8 kg)   BMI 28.05 kg/m  No LMP recorded. Patient is postmenopausal.  General:   Alert,  Well-developed, well-nourished, pleasant and cooperative in NAD Head:  Normocephalic and atraumatic. Eyes:  Sclera clear, no icterus.   Conjunctiva pink. Ears:  Normal auditory acuity. Nose:  No deformity, discharge, or lesions. Mouth:  No deformity or lesions,oropharynx pink & moist. Neck:  Supple; no masses or thyromegaly. Lungs:  Respirations even and unlabored.  Clear throughout to auscultation.   No wheezes, crackles, or rhonchi. No acute distress. Heart:  Regular rate and rhythm; no murmurs, clicks, rubs, or gallops. Abdomen:  Normal bowel sounds. Soft, non-tender and non-distended without masses, hepatosplenomegaly or hernias noted.  No guarding or rebound tenderness.   Rectal: Not performed Msk:  Symmetrical without gross deformities. Good, equal movement & strength bilaterally. Pulses:  Normal pulses noted. Extremities:  No clubbing or edema.  No cyanosis. Neurologic:  Alert and oriented x3;  grossly normal neurologically. Skin:  Intact without significant lesions or rashes. No jaundice. Psych:  Alert and cooperative. Normal mood and affect.  Imaging Studies: Reviewed  Assessment and Plan:   Jennifer Wood is a 70 y.o. female with history of metabolic syndrome, CKD, anemia of chronic kidney disease is seen in consultation for chronic GERD, lower abdominal discomfort  Chronic GERD Discussed about antireflux lifestyle, information provided Increase Protonix to 20 mg twice daily Recommend EGD for further evaluation  Lower abdominal discomfort Patient never had a colonoscopy in her life Recommend colonoscopy for colon cancer screening   Follow up in 4 months   Cephas Darby, MD

## 2022-01-16 ENCOUNTER — Telehealth: Payer: Self-pay

## 2022-01-16 NOTE — Telephone Encounter (Signed)
Spoke with Jennifer Wood  st endo she moved patient over to 01/26/2022 spoke with patient called patient and checked and made sure she knew 01/26/2022 for her procedure and answered all her questions

## 2022-01-26 ENCOUNTER — Ambulatory Visit: Payer: Medicare Other | Admitting: Anesthesiology

## 2022-01-26 ENCOUNTER — Other Ambulatory Visit: Payer: Self-pay

## 2022-01-26 ENCOUNTER — Ambulatory Visit
Admission: RE | Admit: 2022-01-26 | Discharge: 2022-01-26 | Disposition: A | Discharge status: Home / Self Care | Payer: Medicare Other | Attending: Gastroenterology | Admitting: Gastroenterology

## 2022-01-26 ENCOUNTER — Encounter: Payer: Self-pay | Admitting: Gastroenterology

## 2022-01-26 ENCOUNTER — Encounter
Admission: RE | Disposition: A | Discharge status: Home / Self Care | Payer: Self-pay | Source: Home / Self Care | Attending: Gastroenterology

## 2022-01-26 DIAGNOSIS — D122 Benign neoplasm of ascending colon: Secondary | ICD-10-CM | POA: Diagnosis not present

## 2022-01-26 DIAGNOSIS — Z1211 Encounter for screening for malignant neoplasm of colon: Secondary | ICD-10-CM | POA: Diagnosis present

## 2022-01-26 DIAGNOSIS — E119 Type 2 diabetes mellitus without complications: Secondary | ICD-10-CM | POA: Diagnosis not present

## 2022-01-26 DIAGNOSIS — K449 Diaphragmatic hernia without obstruction or gangrene: Secondary | ICD-10-CM | POA: Insufficient documentation

## 2022-01-26 DIAGNOSIS — I10 Essential (primary) hypertension: Secondary | ICD-10-CM | POA: Diagnosis not present

## 2022-01-26 DIAGNOSIS — K219 Gastro-esophageal reflux disease without esophagitis: Secondary | ICD-10-CM | POA: Diagnosis not present

## 2022-01-26 DIAGNOSIS — K635 Polyp of colon: Secondary | ICD-10-CM

## 2022-01-26 DIAGNOSIS — F17210 Nicotine dependence, cigarettes, uncomplicated: Secondary | ICD-10-CM | POA: Insufficient documentation

## 2022-01-26 DIAGNOSIS — R12 Heartburn: Secondary | ICD-10-CM | POA: Insufficient documentation

## 2022-01-26 HISTORY — DX: Gastro-esophageal reflux disease without esophagitis: K21.9

## 2022-01-26 HISTORY — PX: ESOPHAGOGASTRODUODENOSCOPY: SHX5428

## 2022-01-26 HISTORY — PX: COLONOSCOPY WITH PROPOFOL: SHX5780

## 2022-01-26 HISTORY — DX: Cerebral infarction, unspecified: I63.9

## 2022-01-26 LAB — GLUCOSE, CAPILLARY: Glucose-Capillary: 126 mg/dL — ABNORMAL HIGH (ref 70–99)

## 2022-01-26 SURGERY — COLONOSCOPY WITH PROPOFOL
Anesthesia: General

## 2022-01-26 MED ORDER — PROPOFOL 10 MG/ML IV BOLUS
INTRAVENOUS | Status: DC | PRN
  Administered 2022-01-26: 70 mg via INTRAVENOUS
  Administered 2022-01-26: 50 mg via INTRAVENOUS

## 2022-01-26 MED ORDER — SODIUM CHLORIDE 0.9 % IV SOLN: INTRAVENOUS | Status: DC

## 2022-01-26 MED ORDER — LIDOCAINE HCL (CARDIAC) PF 100 MG/5ML IV SOSY
PREFILLED_SYRINGE | INTRAVENOUS | Status: DC | PRN
  Administered 2022-01-26: 30 mg via INTRAVENOUS

## 2022-01-26 NOTE — H&P (Signed)
[name], MD [address] [place], [place] [address] Phone:[phone] Fax : [phone]  Primary Care Physician:  Ellamae Sia, MD (Inactive) Primary Gastroenterologist:  Dr. Allen Norris  Pre-Procedure History & Physical: HPI:  Jennifer Wood is a 71 y.o. female is here for an endoscopy and colonoscopy.   Past Medical History:  Diagnosis Date   Anemia    Cataract    right eye getting it fixed 06/11/2021   Depression    Diabetes mellitus without complication (HCC)    GERD (gastroesophageal reflux disease)    Gout    Hypercholesteremia    Hypertension    Monoclonal gammopathy    Schizophrenia ([place])    pt denies this dx   Stroke [facility])     Past Surgical History:  Procedure Laterality Date   CATARACT EXTRACTION W/PHACO Left 03/11/2016   Procedure: CATARACT EXTRACTION PHACO AND INTRAOCULAR LENS PLACEMENT ([place]);  Surgeon: Leandrew Koyanagi, MD;  Location: [facility];  Service: Ophthalmology;  Laterality: Left;  DIABETIC - insulin   ORIF TIBIA PLATEAU Right 09/05/06   [facility]   TUBAL LIGATION      Prior to Admission medications   Medication Sig Start Date End Date Taking? Authorizing Provider  ACCU-CHEK AVIVA PLUS test strip 1 each daily. 10/27/21  Yes [provider]  JARDIANCE 10 MG TABS tablet Take 10 mg by mouth daily. 10/23/21  Yes [provider]  lisinopril (ZESTRIL) 2.5 MG tablet Take 2.5 mg by mouth daily. 10/23/21  Yes [provider]  Sod Picosulfate-Mag Ox-Cit Acd (CLENPIQ) 10-3.5-12 MG-GM -GM/160ML SOLN Take 1 kit by mouth as directed. At 5 PM evening before procedure, drink 1 bottle of Clenpiq, hydrate, drink (5) 8 oz of water. Then do the same thing 5 hours prior to your procedure. 12/08/21  Yes Lin Landsman, MD  aspirin 81 MG chewable tablet Chew by mouth. Patient not taking: Reported on 11/07/2021 08/05/20   [provider]  metFORMIN (GLUCOPHAGE-XR) 500 MG 24 hr tablet Take 500 mg by mouth 2 (two)  times daily. Patient not taking: Reported on 12/08/2021 10/14/21   [provider]  Misc. Devices (WALKER) MISC 1 each by Does not apply route daily as needed. 09/13/21   Arta Silence, MD  pantoprazole (PROTONIX) 20 MG tablet Take 20 mg by mouth daily. Patient not taking: Reported on 11/07/2021 08/12/21   [provider]  pantoprazole (PROTONIX) 20 MG tablet Take 1 tablet (20 mg total) by mouth 2 (two) times daily. 12/08/21 01/07/22  Lin Landsman, MD  pantoprazole (PROTONIX) 40 MG tablet Take 1 tablet (40 mg total) by mouth daily. 09/13/21 11/12/21  Arta Silence, MD  sucralfate (CARAFATE) 1 g tablet Take 1 tablet (1 g total) by mouth 4 (four) times daily. Patient not taking: Reported on 11/07/2021 09/13/21 11/12/21  Arta Silence, MD  traMADol (ULTRAM) 50 MG tablet Take 1 tablet (50 mg total) by mouth every 6 (six) hours as needed for severe pain. Patient not taking: Reported on 11/07/2021 09/13/21 09/13/22  Arta Silence, MD    Allergies as of 12/08/2021 - Review Complete 12/08/2021  Allergen Reaction Noted   Ziprasidone hcl Other (See Comments) 08/05/2020    Family History  Problem Relation Age of Onset   Diabetes Sister    Breast cancer Neg Hx     Social History   Socioeconomic History   Marital status: Divorced    Spouse name: Not on file   Number of children: Not on file  Years of education: Not on file   Highest education level: Not on file  Occupational History   Not on file  Tobacco Use   Smoking status: Some Days    Packs/day: 0.25    Years: 5.00    Pack years: 1.25    Types: Cigarettes   Smokeless tobacco: Former    Types: Snuff  Vaping Use   Vaping Use: Never used  Substance and Sexual Activity   Alcohol use: Yes    Alcohol/week: 1.0 standard drink    Types: 1 Cans of beer per week    Comment: rarely   Drug use: No   Sexual activity: Never  Other Topics Concern   Not on file  Social History Narrative   Not  on file   Social Determinants of Health   Financial Resource Strain: Not on file  Food Insecurity: Not on file  Transportation Needs: Not on file  Physical Activity: Not on file  Stress: Not on file  Social Connections: Not on file  Intimate Partner Violence: Not on file    Review of Systems: See HPI, otherwise negative ROS  Physical Exam: BP (!) 149/66    Pulse 79    Temp 98.6 F (37 C) (Temporal)    Resp 18    Ht 5' 5"  (1.651 m)    Wt 79.4 kg    SpO2 100%    BMI 29.12 kg/m  General:   Alert,  pleasant and cooperative in NAD Head:  Normocephalic and atraumatic. Neck:  Supple; no masses or thyromegaly. Lungs:  Clear throughout to auscultation.    Heart:  Regular rate and rhythm. Abdomen:  Soft, nontender and nondistended. Normal bowel sounds, without guarding, and without rebound.   Neurologic:  Alert and  oriented x4;  grossly normal neurologically.  Impression/Plan: Jennifer Wood is here for an endoscopy and colonoscopy to be performed for GERD and screening  Risks, benefits, limitations, and alternatives regarding  endoscopy and colonoscopy have been reviewed with the patient.  Questions have been answered.  All parties agreeable.   [name], MD  01/26/2022, 1:40 PM

## 2022-01-26 NOTE — Anesthesia Preprocedure Evaluation (Signed)
Anesthesia Evaluation  Patient identified by MRN, date of birth, ID band Patient awake    Reviewed: Allergy & Precautions, NPO status , Patient's Chart, lab work & pertinent test results  History of Anesthesia Complications Negative for: history of anesthetic complications  Airway Mallampati: III  TM Distance: >3 FB Neck ROM: Full    Dental  (+) Poor Dentition, Missing   Pulmonary neg sleep apnea, neg COPD, Current Smoker and Patient abstained from smoking.,    Pulmonary exam normal breath sounds clear to auscultation       Cardiovascular Exercise Tolerance: Good METShypertension, (-) CAD and (-) Past MI (-) dysrhythmias  Rhythm:Regular Rate:Normal - Systolic murmurs    Neuro/Psych PSYCHIATRIC DISORDERS Depression Schizophrenia CVA    GI/Hepatic GERD  ,(+)     (-) substance abuse  ,   Endo/Other  diabetes  Renal/GU negative Renal ROS     Musculoskeletal   Abdominal   Peds  Hematology   Anesthesia Other Findings Past Medical History: No date: Anemia No date: Cataract     Comment:  right eye getting it fixed 06/11/2021 No date: Depression No date: Diabetes mellitus without complication (HCC) No date: GERD (gastroesophageal reflux disease) No date: Gout No date: Hypercholesteremia No date: Hypertension No date: Monoclonal gammopathy No date: Schizophrenia ([place])     Comment:  pt denies this dx No date: Stroke [facility])  Reproductive/Obstetrics                             Anesthesia Physical Anesthesia Plan  ASA: 3  Anesthesia Plan: General   Post-op Pain Management: Minimal or no pain anticipated   Induction: Intravenous  PONV Risk Score and Plan: 2 and Propofol infusion, TIVA and Ondansetron  Airway Management Planned: Nasal Cannula  Additional Equipment: None  Intra-op Plan:   Post-operative Plan:   Informed Consent: I have reviewed the patients History and Physical,  chart, labs and discussed the procedure including the risks, benefits and alternatives for the proposed anesthesia with the patient or authorized representative who has indicated his/her understanding and acceptance.     Dental advisory given  Plan Discussed with: CRNA and Surgeon  Anesthesia Plan Comments: (Discussed risks of anesthesia with patient, including possibility of difficulty with spontaneous ventilation under anesthesia necessitating airway intervention, PONV, and rare risks such as cardiac or respiratory or neurological events, and allergic reactions. Discussed the role of CRNA in patient's perioperative care. Patient understands. Patient counseled on benefits of smoking cessation, and increased perioperative risks associated with continued smoking. )        Anesthesia Quick Evaluation

## 2022-01-26 NOTE — Transfer of Care (Signed)
Immediate Anesthesia Transfer of Care Note  Patient: Jennifer Wood  Procedure(s) Performed: COLONOSCOPY WITH PROPOFOL ESOPHAGOGASTRODUODENOSCOPY (EGD)  Patient Location: PACU  Anesthesia Type:MAC  Level of Consciousness: sedated  Airway & Oxygen Therapy: Patient Spontanous Breathing  Post-op Assessment: Report given to RN and Post -op Vital signs reviewed and stable  Post vital signs: Reviewed and stable  Last Vitals:  Vitals Value Taken Time  BP 125/55 01/26/22 1408  Temp 35.9 C 01/26/22 1408  Pulse 69 01/26/22 1408  Resp 18 01/26/22 1408  SpO2 99 % 01/26/22 1408  Vitals shown include unvalidated device data.  Last Pain:  Vitals:   01/26/22 1408  TempSrc: Temporal  PainSc: 0-No pain         Complications: No notable events documented.

## 2022-01-26 NOTE — Anesthesia Postprocedure Evaluation (Signed)
Anesthesia Post Note  Patient: Jennifer Wood  Procedure(s) Performed: COLONOSCOPY WITH PROPOFOL ESOPHAGOGASTRODUODENOSCOPY (EGD)  Patient location during evaluation: Endoscopy Anesthesia Type: General Level of consciousness: awake and alert Pain management: pain level controlled Vital Signs Assessment: post-procedure vital signs reviewed and stable Respiratory status: spontaneous breathing, nonlabored ventilation, respiratory function stable and patient connected to nasal cannula oxygen Cardiovascular status: blood pressure returned to baseline and stable Postop Assessment: no apparent nausea or vomiting Anesthetic complications: no   No notable events documented.   Last Vitals:  Vitals:   01/26/22 1408 01/26/22 1418  BP: (!) 125/55 (!) 166/70  Pulse: 68 73  Resp: 18 (!) 21  Temp: (!) 35.9 C   SpO2: 99% 98%    Last Pain:  Vitals:   01/26/22 1418  TempSrc:   PainSc: 0-No pain                 Arita Miss

## 2022-01-26 NOTE — Op Note (Signed)
[facility] Gastroenterology Patient Name: Jennifer Wood Procedure Date: 01/26/2022 1:27 PM MRN: [id number] Account #: [id number] Date of Birth: [birth year] Admit Type: Outpatient Age: 71 Room: [facility] ENDO ROOM 3 Gender: Female Note Status: Finalized Instrument Name: Jasper Riling [id number] Procedure:             Colonoscopy Indications:           Screening for colorectal malignant neoplasm Providers:             [name] MD, MD Referring MD:          Remus Blake MD, MD (Referring MD) Medicines:             Propofol per Anesthesia Complications:         No immediate complications. Procedure:             Pre-Anesthesia Assessment:                        - Prior to the procedure, a History and Physical was                         performed, and patient medications and allergies were                         reviewed. The patient's tolerance of previous                         anesthesia was also reviewed. The risks and benefits                         of the procedure and the sedation options and risks                         were discussed with the patient. All questions were                         answered, and informed consent was obtained. Prior                         Anticoagulants: The patient has taken no previous                         anticoagulant or antiplatelet agents. ASA Grade                         Assessment: II - A patient with mild systemic disease.                         After reviewing the risks and benefits, the patient                         was deemed in satisfactory condition to undergo the                         procedure.                        After obtaining informed consent, the colonoscope was  passed under direct vision. Throughout the procedure,                         the patient's blood pressure, pulse, and oxygen                         saturations were monitored continuously. The                          Colonoscope was introduced through the anus and                         advanced to the the cecum, identified by appendiceal                         orifice and ileocecal valve. The colonoscopy was                         performed without difficulty. The patient tolerated                         the procedure well. The quality of the bowel                         preparation was adequate. Findings:      The perianal and digital rectal examinations were normal.      Two sessile polyps were found in the ascending colon. The polyps were 2       to 3 mm in size. These polyps were removed with a cold biopsy forceps.       Resection and retrieval were complete. Impression:            - Two 2 to 3 mm polyps in the ascending colon, removed                         with a cold biopsy forceps. Resected and retrieved. Recommendation:        - Discharge patient to home.                        - Resume previous diet.                        - Continue present medications.                        - Await pathology results.                        - If the pathology report reveals adenomatous tissue,                         then repeat the colonoscopy for surveillance in 7                         years. Procedure Code(s):     --- Professional ---                        [phone], Colonoscopy, flexible; with biopsy, single or  multiple Diagnosis Code(s):     --- Professional ---                        Z12.11, Encounter for screening for malignant neoplasm                         of colon CPT copyright 2019 American Medical Association. All rights reserved. The codes documented in this report are preliminary and upon coder review may  be revised to meet current compliance requirements. [name] MD, MD 01/26/2022 2:05:48 PM This report has been signed electronically. Number of Addenda: 0 Note Initiated On: 01/26/2022 1:27 PM Scope Withdrawal Time: 0 hours 7 minutes 58 seconds  Total  Procedure Duration: 0 hours 12 minutes 32 seconds  Estimated Blood Loss:  Estimated blood loss: none.      [facility]

## 2022-01-26 NOTE — Op Note (Signed)
[facility] Gastroenterology Patient Name: Jennifer Wood Procedure Date: 01/26/2022 1:41 PM MRN: [id number] Account #: [id number] Date of Birth: December 13, 1951 Admit Type: Outpatient Age: 71 Room: [facility] ENDO ROOM 3 Gender: Female Note Status: Finalized Instrument Name: Upper Endoscope [id number] Procedure:             Upper GI endoscopy Indications:           Heartburn Providers:             [name] MD, MD Referring MD:          Remus Blake MD, MD (Referring MD) Medicines:             Propofol per Anesthesia Complications:         No immediate complications. Procedure:             Pre-Anesthesia Assessment:                        - Prior to the procedure, a History and Physical was                         performed, and patient medications and allergies were                         reviewed. The patient's tolerance of previous                         anesthesia was also reviewed. The risks and benefits                         of the procedure and the sedation options and risks                         were discussed with the patient. All questions were                         answered, and informed consent was obtained. Prior                         Anticoagulants: The patient has taken no previous                         anticoagulant or antiplatelet agents. ASA Grade                         Assessment: II - A patient with mild systemic disease.                         After reviewing the risks and benefits, the patient                         was deemed in satisfactory condition to undergo the                         procedure.                        After obtaining informed consent, the endoscope was  passed under direct vision. Throughout the procedure,                         the patient's blood pressure, pulse, and oxygen                         saturations were monitored continuously. The                         Endosonoscope was  introduced through the mouth, and                         advanced to the second part of duodenum. The upper GI                         endoscopy was accomplished without difficulty. The                         patient tolerated the procedure well. Findings:      A small hiatal hernia was present.      The stomach was normal.      The examined duodenum was normal. Impression:            - Small hiatal hernia.                        - Normal stomach.                        - Normal examined duodenum.                        - No specimens collected. Recommendation:        - Discharge patient to home.                        - Resume previous diet.                        - Continue present medications.                        - Perform a colonoscopy today. Procedure Code(s):     --- Professional ---                        [phone], Esophagogastroduodenoscopy, flexible,                         transoral; diagnostic, including collection of                         specimen(s) by brushing or washing, when performed                         (separate procedure) Diagnosis Code(s):     --- Professional ---                        R12, Heartburn CPT copyright 2019 American Medical Association. All rights reserved. The codes documented in this report are preliminary and upon coder review may  be revised to meet current compliance requirements. Dereon Williamsen  Jozette Castrellon MD, MD 01/26/2022 1:48:26 PM This report has been signed electronically. Number of Addenda: 0 Note Initiated On: 01/26/2022 1:41 PM Estimated Blood Loss:  Estimated blood loss: none.      [facility]

## 2022-01-28 ENCOUNTER — Encounter: Payer: Self-pay | Admitting: Gastroenterology

## 2022-01-28 LAB — SURGICAL PATHOLOGY

## 2022-01-29 ENCOUNTER — Encounter: Payer: Self-pay | Admitting: Gastroenterology

## 2022-02-03 ENCOUNTER — Emergency Department
Admission: EM | Admit: 2022-02-03 | Discharge: 2022-02-03 | Disposition: A | Discharge status: Home / Self Care | Payer: Medicare Other | Attending: Emergency Medicine | Admitting: Emergency Medicine

## 2022-02-03 ENCOUNTER — Other Ambulatory Visit: Payer: Self-pay

## 2022-02-03 DIAGNOSIS — R109 Unspecified abdominal pain: Secondary | ICD-10-CM | POA: Diagnosis present

## 2022-02-03 DIAGNOSIS — G8929 Other chronic pain: Secondary | ICD-10-CM | POA: Diagnosis not present

## 2022-02-03 DIAGNOSIS — E1122 Type 2 diabetes mellitus with diabetic chronic kidney disease: Secondary | ICD-10-CM | POA: Insufficient documentation

## 2022-02-03 DIAGNOSIS — N189 Chronic kidney disease, unspecified: Secondary | ICD-10-CM | POA: Insufficient documentation

## 2022-02-03 DIAGNOSIS — I129 Hypertensive chronic kidney disease with stage 1 through stage 4 chronic kidney disease, or unspecified chronic kidney disease: Secondary | ICD-10-CM | POA: Insufficient documentation

## 2022-02-03 LAB — COMPREHENSIVE METABOLIC PANEL
ALT: 11 U/L (ref 0–44)
AST: 15 U/L (ref 15–41)
Albumin: 3.3 g/dL — ABNORMAL LOW (ref 3.5–5.0)
Alkaline Phosphatase: 81 U/L (ref 38–126)
Anion gap: 6 (ref 5–15)
BUN: 77 mg/dL — ABNORMAL HIGH (ref 8–23)
CO2: 22 mmol/L (ref 22–32)
Calcium: 8.5 mg/dL — ABNORMAL LOW (ref 8.9–10.3)
Chloride: 107 mmol/L (ref 98–111)
Creatinine, Ser: 2.85 mg/dL — ABNORMAL HIGH (ref 0.44–1.00)
GFR, Estimated: 17 mL/min — ABNORMAL LOW (ref >60)
Glucose, Bld: 290 mg/dL — ABNORMAL HIGH (ref 70–99)
Potassium: 4.3 mmol/L (ref 3.5–5.1)
Sodium: 135 mmol/L (ref 135–145)
Total Bilirubin: 0.4 mg/dL (ref 0.3–1.2)
Total Protein: 7.3 g/dL (ref 6.5–8.1)

## 2022-02-03 LAB — CBC
HCT: 26.8 % — ABNORMAL LOW (ref 36.0–46.0)
Hemoglobin: 8.7 g/dL — ABNORMAL LOW (ref 12.0–15.0)
MCH: 30.2 pg (ref 26.0–34.0)
MCHC: 32.5 g/dL (ref 30.0–36.0)
MCV: 93.1 fL (ref 80.0–100.0)
Platelets: 262 10*3/uL (ref 150–400)
RBC: 2.88 MIL/uL — ABNORMAL LOW (ref 3.87–5.11)
RDW: 13.1 % (ref 11.5–15.5)
WBC: 9.7 10*3/uL (ref 4.0–10.5)
nRBC: 0 % (ref 0.0–0.2)

## 2022-02-03 LAB — LIPASE, BLOOD: Lipase: 52 U/L — ABNORMAL HIGH (ref 11–51)

## 2022-02-03 MED ORDER — FAMOTIDINE 20 MG PO TABS
40.0000 mg | ORAL_TABLET | Freq: Once | ORAL | Status: AC
Start: 2022-02-03 — End: 2022-02-03
  Administered 2022-02-03: 40 mg via ORAL
  Filled 2022-02-03: qty 2

## 2022-02-03 MED ORDER — METOCLOPRAMIDE HCL 10 MG PO TABS: 10.0000 mg | ORAL_TABLET | Freq: Four times a day (QID) | ORAL | 0 refills | Status: DC | PRN

## 2022-02-03 MED ORDER — METOCLOPRAMIDE HCL 10 MG PO TABS
10.0000 mg | ORAL_TABLET | Freq: Once | ORAL | Status: AC
  Administered 2022-02-03: 10 mg via ORAL
  Filled 2022-02-03: qty 1

## 2022-02-03 MED ORDER — ALUM & MAG HYDROXIDE-SIMETH 200-200-20 MG/5ML PO SUSP
30.0000 mL | Freq: Once | ORAL | Status: AC
Start: 2022-02-03 — End: 2022-02-03
  Administered 2022-02-03: 30 mL via ORAL
  Filled 2022-02-03: qty 30

## 2022-02-03 MED ORDER — ALUMINUM-MAGNESIUM-SIMETHICONE 200-200-20 MG/5ML PO SUSP: 30.0000 mL | Freq: Three times a day (TID) | ORAL | 0 refills | Status: DC

## 2022-02-03 NOTE — ED Provider Notes (Signed)
[facility] Provider Note    Event Date/Time   First MD Initiated Contact with Patient 02/03/22 1944     (approximate)   History   Abdominal Pain   HPI  Jennifer Wood is a 71 y.o. female with a past history of hypertension diabetes schizophrenia who comes ED complaining of left-sided abdominal pain for the past year.  She reports that usually it resolves with Protonix, but now for the past many months, the Protonix is not working well enough.  Electronic medical record reviewed, she recently had upper endoscopy and colonoscopy by gastroenterology on January 26, 2022.  Reports show overall no severe findings.  There was a small hiatal hernia.  There were 2 small colonic polyps that were resected.     Physical Exam   Triage Vital Signs: ED Triage Vitals  Enc Vitals Group     BP 02/03/22 1656 117/83     Pulse Rate 02/03/22 1656 85     Resp 02/03/22 1656 20     Temp 02/03/22 1656 98.4 F (36.9 C)     Temp Source 02/03/22 1656 Oral     SpO2 02/03/22 1656 98 %     Weight 02/03/22 1657 176 lb 5.9 oz (80 kg)     Height 02/03/22 1657 5\' 5"  (1.651 m)     Head Circumference --      Peak Flow --      Pain Score 02/03/22 1657 10     Pain Loc --      Pain Edu? --      Excl. in [place]? --     Most recent vital signs: Vitals:   02/03/22 1656  BP: 117/83  Pulse: 85  Resp: 20  Temp: 98.4 F (36.9 C)  SpO2: 98%     General: Awake, no distress.  CV:  Good peripheral perfusion.  Regular rate and rhythm.  Normal radial pulses Resp:  Normal effort.  Clear to auscultation bilaterally Abd:  No distention.  Soft and nontender Other:  No lower extremity edema.   ED Results / Procedures / Treatments   Labs (all labs ordered are listed, but only abnormal results are displayed) Labs Reviewed  LIPASE, BLOOD - Abnormal; Notable for the following components:      Result Value   Lipase 52 (*)    All other components within normal limits  COMPREHENSIVE  METABOLIC PANEL - Abnormal; Notable for the following components:   Glucose, Bld 290 (*)    BUN 77 (*)    Creatinine, Ser 2.85 (*)    Calcium 8.5 (*)    Albumin 3.3 (*)    GFR, Estimated 17 (*)    All other components within normal limits  CBC - Abnormal; Notable for the following components:   RBC 2.88 (*)    Hemoglobin 8.7 (*)    HCT 26.8 (*)    All other components within normal limits  URINALYSIS, ROUTINE W REFLEX MICROSCOPIC     EKG Interpreted by me Normal sinus rhythm rate of 78.  Left axis, right bundle branch block.  Voltage criteria for LVH in the high lateral leads.  No acute ischemic changes.    RADIOLOGY     PROCEDURES:  Critical Care performed: No  Procedures   MEDICATIONS ORDERED IN ED: Medications  alum & mag hydroxide-simeth (MAALOX/MYLANTA) 200-200-20 MG/5ML suspension 30 mL (has no administration in time range)  metoCLOPramide (REGLAN) tablet 10 mg (has no administration in time range)  famotidine (PEPCID) tablet  40 mg (has no administration in time range)     IMPRESSION / MDM / ASSESSMENT AND PLAN / ED COURSE  I reviewed the triage vital signs and the nursing notes.                                  Patient presents with chronic abdominal pain.  Had a recent colonoscopy and endoscopy which may be contributing to increased pain currently.  Labs are overall unremarkable, stable chronic anemia, stable CKD.  Abdomen is soft and benign and nonsurgical, doubt perforation, intra-abdominal abscess, diverticulitis, GI bleed.  Does not require admission due to the chronicity and normal vital signs and exam today.  I will add Maalox and Reglan to her Protonix and have her follow-up with primary care.     FINAL CLINICAL IMPRESSION(S) / ED DIAGNOSES   Final diagnoses:  Chronic abdominal pain     Rx / DC Orders   ED Discharge Orders          Ordered    metoCLOPramide (REGLAN) 10 MG tablet  Every 6 hours PRN        02/03/22 2034     aluminum-magnesium hydroxide-simethicone (MAALOX) 200-200-20 MG/5ML SUSP  3 times daily before meals & bedtime        02/03/22 2034             Note:  This document was prepared using Dragon voice recognition software and may include unintentional dictation errors.   Carrie Mew, MD 02/03/22 2038

## 2022-02-03 NOTE — ED Triage Notes (Signed)
Pt to ED for left sided abd pain for one year. States had a colonoscopy a couple days ago and discovered she has a hernia and now does not know what to do. States she used to take protonix for pain and had relief and now when takes protonix does not have relief.

## 2022-02-03 NOTE — Discharge Instructions (Signed)
Continue taking your Protonix, and start taking metoclopramide and Maalox as prescribed to help resolve your abdominal discomfort.  Please continue to follow-up with your primary care doctor and gastroenterologist for further evaluation.

## 2022-05-07 ENCOUNTER — Inpatient Hospital Stay: Payer: Medicare Other | Attending: Oncology

## 2022-05-07 ENCOUNTER — Other Ambulatory Visit: Payer: Self-pay

## 2022-05-07 DIAGNOSIS — N189 Chronic kidney disease, unspecified: Secondary | ICD-10-CM | POA: Diagnosis not present

## 2022-05-07 DIAGNOSIS — Z862 Personal history of diseases of the blood and blood-forming organs and certain disorders involving the immune mechanism: Secondary | ICD-10-CM | POA: Diagnosis present

## 2022-05-07 LAB — CBC WITH DIFFERENTIAL/PLATELET
Abs Immature Granulocytes: 0.02 10*3/uL (ref 0.00–0.07)
Basophils Absolute: 0.1 10*3/uL (ref 0.0–0.1)
Basophils Relative: 1 %
Eosinophils Absolute: 0.1 10*3/uL (ref 0.0–0.5)
Eosinophils Relative: 2 %
HCT: 28.8 % — ABNORMAL LOW (ref 36.0–46.0)
Hemoglobin: 9.5 g/dL — ABNORMAL LOW (ref 12.0–15.0)
Immature Granulocytes: 0 %
Lymphocytes Relative: 24 %
Lymphs Abs: 1.7 10*3/uL (ref 0.7–4.0)
MCH: 30.1 pg (ref 26.0–34.0)
MCHC: 33 g/dL (ref 30.0–36.0)
MCV: 91.1 fL (ref 80.0–100.0)
Monocytes Absolute: 0.5 10*3/uL (ref 0.1–1.0)
Monocytes Relative: 6 %
Neutro Abs: 4.8 10*3/uL (ref 1.7–7.7)
Neutrophils Relative %: 67 %
Platelets: 272 10*3/uL (ref 150–400)
RBC: 3.16 MIL/uL — ABNORMAL LOW (ref 3.87–5.11)
RDW: 12.3 % (ref 11.5–15.5)
WBC: 7.2 10*3/uL (ref 4.0–10.5)
nRBC: 0 % (ref 0.0–0.2)

## 2022-05-07 LAB — IRON AND TIBC
Iron: 54 ug/dL (ref 28–170)
Saturation Ratios: 21 % (ref 10.4–31.8)
TIBC: 262 ug/dL (ref 250–450)
UIBC: 208 ug/dL

## 2022-05-07 LAB — FERRITIN: Ferritin: 70 ng/mL (ref 11–307)

## 2022-06-08 ENCOUNTER — Telehealth: Payer: Self-pay | Admitting: Oncology

## 2022-06-08 NOTE — Telephone Encounter (Signed)
Left VM with patient to make her aware of appointment on 6/19. Patient is established with Dr. Janese Banks but needs to be seen earlier per [facility]. Spoke with her daughter Jennifer Wood who will make sure patient receives the appointment info.

## 2022-06-15 ENCOUNTER — Encounter: Payer: Self-pay | Admitting: Oncology

## 2022-06-15 ENCOUNTER — Inpatient Hospital Stay: Payer: Medicare Other | Attending: Oncology | Admitting: Oncology

## 2022-06-15 VITALS — BP 171/81 | HR 75 | Temp 98.7°F | Resp 20 | Wt 176.4 lb

## 2022-06-15 DIAGNOSIS — D631 Anemia in chronic kidney disease: Secondary | ICD-10-CM | POA: Insufficient documentation

## 2022-06-15 DIAGNOSIS — Z862 Personal history of diseases of the blood and blood-forming organs and certain disorders involving the immune mechanism: Secondary | ICD-10-CM

## 2022-06-15 DIAGNOSIS — N184 Chronic kidney disease, stage 4 (severe): Secondary | ICD-10-CM | POA: Insufficient documentation

## 2022-06-15 DIAGNOSIS — E1122 Type 2 diabetes mellitus with diabetic chronic kidney disease: Secondary | ICD-10-CM | POA: Insufficient documentation

## 2022-06-15 DIAGNOSIS — F17210 Nicotine dependence, cigarettes, uncomplicated: Secondary | ICD-10-CM | POA: Diagnosis not present

## 2022-06-15 DIAGNOSIS — Z833 Family history of diabetes mellitus: Secondary | ICD-10-CM | POA: Insufficient documentation

## 2022-06-15 DIAGNOSIS — I129 Hypertensive chronic kidney disease with stage 1 through stage 4 chronic kidney disease, or unspecified chronic kidney disease: Secondary | ICD-10-CM | POA: Insufficient documentation

## 2022-06-15 DIAGNOSIS — Z79899 Other long term (current) drug therapy: Secondary | ICD-10-CM | POA: Insufficient documentation

## 2022-06-15 DIAGNOSIS — N189 Chronic kidney disease, unspecified: Secondary | ICD-10-CM

## 2022-06-15 DIAGNOSIS — D472 Monoclonal gammopathy: Secondary | ICD-10-CM | POA: Insufficient documentation

## 2022-06-15 DIAGNOSIS — Z8673 Personal history of transient ischemic attack (TIA), and cerebral infarction without residual deficits: Secondary | ICD-10-CM | POA: Insufficient documentation

## 2022-06-15 DIAGNOSIS — K219 Gastro-esophageal reflux disease without esophagitis: Secondary | ICD-10-CM | POA: Insufficient documentation

## 2022-06-15 NOTE — Progress Notes (Signed)
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  She has now been referred to reestablish care.   Results of bloodwork from 03/09/2017 were as follows: CBC showed white count of 9.6, H&H of 10.3/29.9 and a platelet count of 301. CMP was significant for BUN of 39 and creatinine of 1.56. Reticulocyte count was 1.4% and low for the degree of anemia. Ferritin was normal at 51 and iron studies are within normal limits. B12 and folate were within normal limits. TSH was normal at 1.49. Haptoglobin was normal at 187. ESR was elevated at 70. Multiple myeloma panel showed elevated IgG of 1852. Serum monoclonal protein of 0.7 g was noted. Lambda light chain specificity. LDH was normal at 135 and beta-2 microglobulin was elevated at 4.2. 24-hour urine protein revealed 1gm proteinuria and 39 mg monoclonal protein in 24 hours.    Bone marrow biopsy on 04/20/17 showed normocellular marrow with trilineage hematopoiesis and mild increase in plasma cells 9% (5-10%)   Most recent labs from 12/16/2020 showed white count of 7.5, H&H of 10.6/31.9 with a platelet count of 288.  Serum creatinine was elevated at 1.5.  Serum calcium was normal at 9.4 and total protein normal at 7.1.  M spike was 0.9 on serum protein electrophoresis.    Interval history-patient reports feeling well overall and denies any specific complaints at this time.  She follows up with  Dr. 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  Denies any fatigue  ECOG PS- 1 Pain scale- 0   Review of systems- Review of Systems  Constitutional:  Negative for chills, fever, malaise/fatigue and weight loss.  HENT:  Negative for congestion, ear discharge and nosebleeds.   Eyes:  Negative for blurred vision.  Respiratory:  Negative for cough, hemoptysis, sputum production, shortness of breath and wheezing.   Cardiovascular:  Negative for chest pain, palpitations, orthopnea and claudication.  Gastrointestinal:  Negative for abdominal pain, blood in stool, constipation, diarrhea, heartburn, melena, nausea and vomiting.  Genitourinary:  Negative for dysuria, flank pain, frequency, hematuria and urgency.  Musculoskeletal:  Negative for back pain, joint pain and myalgias.  Skin:  Negative for rash.  Neurological:  Negative for dizziness, tingling, focal weakness, seizures, weakness and headaches.  Endo/Heme/Allergies:  Does not bruise/bleed easily.  Psychiatric/Behavioral:  Negative for depression and suicidal ideas. The patient does not have insomnia.       Allergies  Allergen Reactions   Ziprasidone Hcl Other (See Comments)     Past Medical History:  Diagnosis Date   Anemia    Cataract    right eye getting it fixed 06/11/2021   Depression    Diabetes mellitus without complication (HCC)    GERD (gastroesophageal reflux disease)    Gout    Hypercholesteremia    Hypertension    Monoclonal gammopathy    Schizophrenia ([place])    pt denies this dx   Stroke ([place])  Past Surgical History:  Procedure Laterality Date   CATARACT EXTRACTION W/PHACO Left 03/11/2016   Procedure: CATARACT EXTRACTION PHACO AND INTRAOCULAR LENS PLACEMENT (IOC);  Surgeon: Leandrew Koyanagi, MD;  Location: [facility];  Service: Ophthalmology;  Laterality: Left;  DIABETIC - insulin   COLONOSCOPY WITH PROPOFOL N/A 01/26/2022   Procedure: COLONOSCOPY WITH PROPOFOL;  Surgeon: [name], MD;  Location: [facility] ENDOSCOPY;  Service: Endoscopy;  Laterality: N/A;   ESOPHAGOGASTRODUODENOSCOPY N/A 01/26/2022   Procedure: ESOPHAGOGASTRODUODENOSCOPY (EGD);  Surgeon: [name], MD;  Location: [facility] ENDOSCOPY;  Service: Endoscopy;  Laterality: N/A;   ORIF TIBIA PLATEAU Right 09/05/06   [facility]   TUBAL LIGATION      Social History   Socioeconomic History   Marital status: Divorced    Spouse name: Not on file   Number of children: Not on file   Years of education: Not on file   Highest education level: Not on file  Occupational History   Not on file  Tobacco Use   Smoking status: Some Days    Packs/day: 0.25    Years: 5.00    Total pack years: 1.25    Types: Cigarettes   Smokeless tobacco: Former    Types: Snuff  Vaping Use   Vaping Use: Never used  Substance and Sexual Activity   Alcohol use: Yes    Alcohol/week: 1.0 standard drink of alcohol    Types: 1 Cans of beer per week    Comment: rarely   Drug use: No   Sexual activity: Never  Other Topics Concern   Not on file  Social History Narrative   Not on file   Social Determinants of Health   Financial Resource Strain: Not on file  Food Insecurity: Not on file  Transportation Needs: Not on file  Physical Activity: Not on file  Stress: Not on file  Social Connections: Not on file  Intimate Partner Violence: Not on file    Family History  Problem Relation Age of Onset   Diabetes Sister    Breast cancer Neg Hx      Current Outpatient Medications:    ACCU-CHEK AVIVA PLUS test strip, 1 each daily., Disp: , Rfl:    aluminum-magnesium hydroxide-simethicone (MAALOX) 659-935-70 MG/5ML SUSP, Take 30 mLs by mouth 4 (four) times daily -  before meals and at bedtime., Disp: 355 mL, Rfl: 0   JARDIANCE 10 MG TABS tablet, Take 10 mg by mouth daily., Disp: , Rfl:    lisinopril (ZESTRIL) 2.5 MG tablet, Take 2.5 mg by mouth daily., Disp: , Rfl:    metoCLOPramide (REGLAN) 10 MG tablet, Take 1 tablet (10 mg total) by  mouth every 6 (six) hours as needed., Disp: 30 tablet, Rfl: 0   Misc. Devices (WALKER) MISC, 1 each by Does not apply route daily as needed., Disp: 1 each, Rfl: 0   pantoprazole (PROTONIX) 20 MG tablet, Take 1 tablet (20 mg total) by mouth 2 (two) times daily., Disp: 30 tablet, Rfl: 2   pantoprazole (PROTONIX) 40 MG tablet, Take 1 tablet (40 mg total) by mouth daily., Disp: 30 tablet, Rfl: 1   Sod Picosulfate-Mag Ox-Cit Acd (CLENPIQ) 10-3.5-12 MG-GM -GM/160ML SOLN, Take 1 kit by mouth as directed. At 5 PM evening before procedure, drink 1 bottle of Clenpiq, hydrate, drink (5) 8 oz of water. Then do the same thing 5 hours prior to your procedure., Disp: 320 mL, Rfl: 0   aspirin 81 MG chewable tablet, Chew by mouth. (Patient not taking: Reported on 11/07/2021),  Disp: , Rfl:    metFORMIN (GLUCOPHAGE-XR) 500 MG 24 hr tablet, Take 500 mg by mouth 2 (two) times daily. (Patient not taking: Reported on 12/08/2021), Disp: , Rfl:    pantoprazole (PROTONIX) 20 MG tablet, Take 20 mg by mouth daily. (Patient not taking: Reported on 11/07/2021), Disp: , Rfl:    sucralfate (CARAFATE) 1 g tablet, Take 1 tablet (1 g total) by mouth 4 (four) times daily. (Patient not taking: Reported on 11/07/2021), Disp: 120 tablet, Rfl: 1   traMADol (ULTRAM) 50 MG tablet, Take 1 tablet (50 mg total) by mouth every 6 (six) hours as needed for severe pain. (Patient not taking: Reported on 11/07/2021), Disp: 15 tablet, Rfl: 0  Physical exam:  Vitals:   06/15/22 0949  BP: (!) 171/81  Pulse: 75  Resp: 20  Temp: 98.7 F (37.1 C)  SpO2: 100%  Weight: 176 lb 6.4 oz (80 kg)   Physical Exam Constitutional:      General: She is not in acute distress. Cardiovascular:     Rate and Rhythm: Normal rate and regular rhythm.     Heart sounds: Normal heart sounds.  Pulmonary:     Effort: Pulmonary effort is normal.     Breath sounds: Normal breath sounds.  Abdominal:     General: Bowel sounds are normal.     Palpations: Abdomen is  soft.  Skin:    General: Skin is warm and dry.  Neurological:     Mental Status: She is alert and oriented to person, place, and time.         Latest Ref Rng & Units 02/03/2022    4:58 PM  CMP  Glucose 70 - 99 mg/dL 290   BUN 8 - 23 mg/dL 77   Creatinine 0.44 - 1.00 mg/dL 2.85   Sodium 135 - 145 mmol/L 135   Potassium 3.5 - 5.1 mmol/L 4.3   Chloride 98 - 111 mmol/L 107   CO2 22 - 32 mmol/L 22   Calcium 8.9 - 10.3 mg/dL 8.5   Total Protein 6.5 - 8.1 g/dL 7.3   Total Bilirubin 0.3 - 1.2 mg/dL 0.4   Alkaline Phos 38 - 126 U/L 81   AST 15 - 41 U/L 15   ALT 0 - 44 U/L 11       Latest Ref Rng & Units 05/07/2022   10:33 AM  CBC  WBC 4.0 - 10.5 K/uL 7.2   Hemoglobin 12.0 - 15.0 g/dL 9.5   Hematocrit 36.0 - 46.0 % 28.8   Platelets 150 - 400 K/uL 272     No images are attached to the encounter.  No results found.   Assessment and plan- Patient is a 71 y.o. female who is here for follow-up of following issues:  Normocytic anemia: Ferritin levels are normal at 70 ongoing patients with chronic kidney disease report attempt to keep a close at 200.  Iron saturation is 21%.  I am holding off on IV iron at this time and we will repeat her CBC ferritin and iron studies in 3 in 6 months and see her back in 6 months.  Overall her hemoglobin has remained stable between 9.5-10.5.  She has not required IV iron or EPO so far.  IgG MGUS: We will repeat myeloma panel in 3 months time   Visit Diagnosis 1. History of anemia due to chronic kidney disease   2. MGUS (monoclonal gammopathy of unknown significance)      Dr. Randa Evens, MD, MPH [facility]  at [facility] [phone] 06/15/2022 10:24 AM

## 2022-09-15 ENCOUNTER — Other Ambulatory Visit: Payer: Self-pay

## 2022-09-15 ENCOUNTER — Inpatient Hospital Stay: Payer: Medicare Other | Attending: Oncology

## 2022-09-15 ENCOUNTER — Emergency Department
Admission: EM | Admit: 2022-09-15 | Discharge: 2022-09-15 | Disposition: A | Discharge status: Home / Self Care | Payer: Medicare Other | Attending: Emergency Medicine | Admitting: Emergency Medicine

## 2022-09-15 ENCOUNTER — Emergency Department: Payer: Medicare Other

## 2022-09-15 DIAGNOSIS — R911 Solitary pulmonary nodule: Secondary | ICD-10-CM | POA: Diagnosis not present

## 2022-09-15 DIAGNOSIS — Z862 Personal history of diseases of the blood and blood-forming organs and certain disorders involving the immune mechanism: Secondary | ICD-10-CM | POA: Insufficient documentation

## 2022-09-15 DIAGNOSIS — I10 Essential (primary) hypertension: Secondary | ICD-10-CM | POA: Insufficient documentation

## 2022-09-15 DIAGNOSIS — D472 Monoclonal gammopathy: Secondary | ICD-10-CM | POA: Diagnosis present

## 2022-09-15 DIAGNOSIS — Y92410 Unspecified street and highway as the place of occurrence of the external cause: Secondary | ICD-10-CM | POA: Diagnosis not present

## 2022-09-15 DIAGNOSIS — S161XXA Strain of muscle, fascia and tendon at neck level, initial encounter: Secondary | ICD-10-CM | POA: Insufficient documentation

## 2022-09-15 DIAGNOSIS — S199XXA Unspecified injury of neck, initial encounter: Secondary | ICD-10-CM | POA: Diagnosis present

## 2022-09-15 DIAGNOSIS — R519 Headache, unspecified: Secondary | ICD-10-CM | POA: Diagnosis not present

## 2022-09-15 DIAGNOSIS — E119 Type 2 diabetes mellitus without complications: Secondary | ICD-10-CM | POA: Diagnosis not present

## 2022-09-15 DIAGNOSIS — N189 Chronic kidney disease, unspecified: Secondary | ICD-10-CM | POA: Diagnosis not present

## 2022-09-15 LAB — CBC
HCT: 28.8 % — ABNORMAL LOW (ref 36.0–46.0)
Hemoglobin: 9.9 g/dL — ABNORMAL LOW (ref 12.0–15.0)
MCH: 30.1 pg (ref 26.0–34.0)
MCHC: 34.4 g/dL (ref 30.0–36.0)
MCV: 87.5 fL (ref 80.0–100.0)
Platelets: 266 10*3/uL (ref 150–400)
RBC: 3.29 MIL/uL — ABNORMAL LOW (ref 3.87–5.11)
RDW: 12.3 % (ref 11.5–15.5)
WBC: 9.7 10*3/uL (ref 4.0–10.5)
nRBC: 0 % (ref 0.0–0.2)

## 2022-09-15 LAB — IRON AND TIBC
Iron: 39 ug/dL (ref 28–170)
Saturation Ratios: 14 % (ref 10.4–31.8)
TIBC: 284 ug/dL (ref 250–450)
UIBC: 245 ug/dL

## 2022-09-15 LAB — FERRITIN: Ferritin: 61 ng/mL (ref 11–307)

## 2022-09-15 MED ORDER — METHOCARBAMOL 500 MG PO TABS: 500.0000 mg | ORAL_TABLET | Freq: Four times a day (QID) | ORAL | 0 refills | Status: DC
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
## 2022-09-15 NOTE — Discharge Instructions (Signed)
Follow-up with your regular doctor as needed.  Return for worsening.  Take your regular medications.  Take Tylenol and ibuprofen for pain as needed.  Muscle relaxer for the neck pain for 5 days Follow-up with orthopedics if your neck is not better within 1 week
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
## 2022-09-16 ENCOUNTER — Other Ambulatory Visit: Payer: Self-pay | Admitting: Primary Care

## 2022-09-16 ENCOUNTER — Other Ambulatory Visit: Payer: Self-pay | Admitting: Family Medicine

## 2022-09-16 DIAGNOSIS — Z1231 Encounter for screening mammogram for malignant neoplasm of breast: Secondary | ICD-10-CM

## 2022-09-21 LAB — MULTIPLE MYELOMA PANEL, SERUM
Albumin SerPl Elph-Mcnc: 3.6 g/dL (ref 2.9–4.4)
Albumin/Glob SerPl: 1.1 (ref 0.7–1.7)
Alpha 1: 0.1 g/dL (ref 0.0–0.4)
Alpha2 Glob SerPl Elph-Mcnc: 0.6 g/dL (ref 0.4–1.0)
B-Globulin SerPl Elph-Mcnc: 1.1 g/dL (ref 0.7–1.3)
Gamma Glob SerPl Elph-Mcnc: 1.7 g/dL (ref 0.4–1.8)
Globulin, Total: 3.5 g/dL (ref 2.2–3.9)
IgA: 209 mg/dL (ref 87–352)
IgG (Immunoglobin G), Serum: 1808 mg/dL — ABNORMAL HIGH (ref 586–1602)
IgM (Immunoglobulin M), Srm: 146 mg/dL (ref 26–217)
M Protein SerPl Elph-Mcnc: 0.8 g/dL — ABNORMAL HIGH
Total Protein ELP: 7.1 g/dL (ref 6.0–8.5)

## 2022-09-24 ENCOUNTER — Encounter: Payer: Self-pay | Admitting: Student in an Organized Health Care Education/Training Program

## 2022-09-24 ENCOUNTER — Ambulatory Visit (INDEPENDENT_AMBULATORY_CARE_PROVIDER_SITE_OTHER): Payer: Medicare Other | Admitting: Student in an Organized Health Care Education/Training Program

## 2022-09-24 VITALS — BP 132/82 | HR 71 | Temp 98.0°F | Ht 66.0 in | Wt 180.8 lb

## 2022-09-24 DIAGNOSIS — R918 Other nonspecific abnormal finding of lung field: Secondary | ICD-10-CM

## 2022-09-24 NOTE — Patient Instructions (Signed)
Today, we discussed then nodules noted on a CT scan from September of 2022. I will be ordering a repeat CT scan of the chest and would like to see you for follow up afterwards.

## 2022-09-24 NOTE — Progress Notes (Signed)
Synopsis: Referred in for pulmonary nodules by Versie Starks, PA-C  Assessment & Plan:   1. Pulmonary nodules  Jennifer Wood is presenting for an evaluation of multiple pulmonary nodules seen on a CT scan of the chest from a year ago (08/2021). She is asymptomatic, and has no complaints. There are no alarming symptoms. She used to work at a [employer] and is a former smoker (reports smoking 5 cigarettes a day on and off for most of her life).  Given the CT from 08/2021 showed multiple sub-centimeter nodules (largest measuring 6 mm in the RUL), I suspect the nodules are inflammatory in nature. That said, she will benefit from a repeat CT scan of the chest to evaluate for progression.  - CT CHEST WO CONTRAST; Future  Return in about 4 weeks (around 10/22/2022).  I spent 45 minutes caring for this patient today, including preparing to see the patient, obtaining and/or reviewing separately obtained history, performing a medically appropriate examination and/or evaluation, counseling and educating the patient/family/caregiver, ordering medications, tests, or procedures, and documenting clinical information in the electronic health record  Armando Reichert, MD [facility] 09/24/2022 2:23 PM    End of visit medications:  No orders of the defined types were placed in this encounter.    Current Outpatient Medications:    atorvastatin (LIPITOR) 40 MG tablet, Take 40 mg by mouth daily., Disp: , Rfl:    omeprazole (PRILOSEC) 40 MG capsule, Take 1 capsule by mouth daily., Disp: , Rfl:    polyethylene glycol (MIRALAX / GLYCOLAX) 17 g packet, Take 17 g by mouth daily., Disp: , Rfl:    ACCU-CHEK AVIVA PLUS test strip, 1 each daily., Disp: , Rfl:    aluminum-magnesium hydroxide-simethicone (MAALOX) 937-342-87 MG/5ML SUSP, Take 30 mLs by mouth 4 (four) times daily -  before meals and at bedtime., Disp: 355 mL, Rfl: 0   aspirin 81 MG chewable tablet, Chew by mouth., Disp: , Rfl:     JARDIANCE 10 MG TABS tablet, Take 10 mg by mouth daily., Disp: , Rfl:    lisinopril (ZESTRIL) 2.5 MG tablet, Take 2.5 mg by mouth daily., Disp: , Rfl:    metFORMIN (GLUCOPHAGE-XR) 500 MG 24 hr tablet, Take 500 mg by mouth 2 (two) times daily., Disp: , Rfl:    methocarbamol (ROBAXIN) 500 MG tablet, Take 1 tablet (500 mg total) by mouth 4 (four) times daily., Disp: 20 tablet, Rfl: 0   metoCLOPramide (REGLAN) 10 MG tablet, Take 1 tablet (10 mg total) by mouth every 6 (six) hours as needed., Disp: 30 tablet, Rfl: 0   Misc. Devices (WALKER) MISC, 1 each by Does not apply route daily as needed., Disp: 1 each, Rfl: 0   pantoprazole (PROTONIX) 20 MG tablet, Take 20 mg by mouth daily., Disp: , Rfl:    pantoprazole (PROTONIX) 20 MG tablet, Take 1 tablet (20 mg total) by mouth 2 (two) times daily., Disp: 30 tablet, Rfl: 2   pantoprazole (PROTONIX) 40 MG tablet, Take 1 tablet (40 mg total) by mouth daily., Disp: 30 tablet, Rfl: 1   Sod Picosulfate-Mag Ox-Cit Acd (CLENPIQ) 10-3.5-12 MG-GM -GM/160ML SOLN, Take 1 kit by mouth as directed. At 5 PM evening before procedure, drink 1 bottle of Clenpiq, hydrate, drink (5) 8 oz of water. Then do the same thing 5 hours prior to your procedure., Disp: 320 mL, Rfl: 0   sucralfate (CARAFATE) 1 g tablet, Take 1 tablet (1 g total) by mouth 4 (four) times daily. (Patient  not taking: Reported on 11/07/2021), Disp: 120 tablet, Rfl: 1   Subjective:   PATIENT ID: Jennifer Wood GENDER: female DOB: August 14, 1951, MRN: [id number]  Chief Complaint  Patient presents with   Consult    Lung Nodule found on Cervical CT. No SOB, cough or wheezing.    HPI  Jennifer Wood is a pleasant 71 year old female who is presenting to clinic for the evaluation of pulmonary nodules. The patient is unsure why she was referred and she has no symptoms. She was inquiring whether I would help with her hernia.  She has no symptoms and no complaints. She has no shortness of breath, no chest pain, no chest  tightness, no wheezing, no cough, no sputum production, and no hemoptysis. Similarly, she denies fevers, chills, night sweats, weight loss, rashes, vision changes, swallowing difficulty, runny nose, or abdominal pain. She reports having a hernia for which she awaits a workup.  She used to work for a [employer] (started at 73, returned at 53). She is a former smoker (reports 5 cigarettes off and on for most of her life, last cigarette was a couple of months ago). She lives with her granddaughter. She has no pets at home and denies having any mold.  Ancillary information including prior medications, full medical/surgical/family/social histories, and PFTs (when available) are listed below and have been reviewed.   Review of Systems  Constitutional:  Negative for chills, fever, malaise/fatigue and weight loss.  HENT:  Negative for congestion, ear discharge, hearing loss and sinus pain.   Eyes:  Negative for blurred vision and double vision.  Respiratory:  Negative for cough, hemoptysis, sputum production, shortness of breath and wheezing.   Cardiovascular:  Negative for chest pain, palpitations, orthopnea and leg swelling.  Skin:  Negative for itching and rash.  Neurological:  Negative for focal weakness.     Objective:   Vitals:   09/24/22 1400  BP: 132/82  Pulse: 71  Temp: 98 F (36.7 C)  SpO2: 98%  Weight: 180 lb 12.8 oz (82 kg)  Height: 5' 6"  (1.676 m)   98% on RA  BMI Readings from Last 3 Encounters:  09/24/22 29.18 kg/m  09/15/22 28.82 kg/m  06/15/22 29.35 kg/m   Wt Readings from Last 3 Encounters:  09/24/22 180 lb 12.8 oz (82 kg)  09/15/22 178 lb 9.2 oz (81 kg)  06/15/22 176 lb 6.4 oz (80 kg)    Physical Exam Constitutional:      Appearance: She is obese.  HENT:     Head: Normocephalic.     Mouth/Throat:     Mouth: Mucous membranes are moist.  Eyes:     Extraocular Movements: Extraocular movements intact.  Cardiovascular:     Rate and Rhythm: Normal rate  and regular rhythm.     Pulses: Normal pulses.     Heart sounds: Normal heart sounds.  Pulmonary:     Breath sounds: Normal breath sounds.  Abdominal:     General: There is distension.     Palpations: Abdomen is soft.  Musculoskeletal:     Cervical back: Normal range of motion and neck supple.  Neurological:     General: No focal deficit present.     Mental Status: She is alert. Mental status is at baseline.       Ancillary Information    Past Medical History:  Diagnosis Date   Anemia    Cataract    right eye getting it fixed 06/11/2021   Depression  Diabetes mellitus without complication (HCC)    GERD (gastroesophageal reflux disease)    Gout    Hypercholesteremia    Hypertension    Monoclonal gammopathy    Schizophrenia ([place])    pt denies this dx   Stroke [facility])      Family History  Problem Relation Age of Onset   Diabetes Sister    Breast cancer Neg Hx      Past Surgical History:  Procedure Laterality Date   CATARACT EXTRACTION W/PHACO Left 03/11/2016   Procedure: CATARACT EXTRACTION PHACO AND INTRAOCULAR LENS PLACEMENT ([place]);  Surgeon: Leandrew Koyanagi, MD;  Location: [facility];  Service: Ophthalmology;  Laterality: Left;  DIABETIC - insulin   COLONOSCOPY WITH PROPOFOL N/A 01/26/2022   Procedure: COLONOSCOPY WITH PROPOFOL;  Surgeon: [name], MD;  Location: [facility] ENDOSCOPY;  Service: Endoscopy;  Laterality: N/A;   ESOPHAGOGASTRODUODENOSCOPY N/A 01/26/2022   Procedure: ESOPHAGOGASTRODUODENOSCOPY (EGD);  Surgeon: [name], MD;  Location: [facility] ENDOSCOPY;  Service: Endoscopy;  Laterality: N/A;   ORIF TIBIA PLATEAU Right 09/05/06   [facility]   TUBAL LIGATION      Social History   Socioeconomic History   Marital status: Divorced    Spouse name: Not on file   Number of children: Not on file   Years of education: Not on file   Highest education level: Not on file  Occupational History   Not on file  Tobacco Use   Smoking status: Former     Packs/day: 0.50    Years: 5.00    Total pack years: 2.50    Types: Cigarettes    Quit date: 2021    Years since quitting: 2.7   Smokeless tobacco: Former    Types: Snuff   Tobacco comments:    Quit 2 years ago. At her heaviest she smoked 2 packs a week.  Vaping Use   Vaping Use: Never used  Substance and Sexual Activity   Alcohol use: Yes    Alcohol/week: 1.0 standard drink of alcohol    Types: 1 Cans of beer per week    Comment: rarely   Drug use: No   Sexual activity: Never  Other Topics Concern   Not on file  Social History Narrative   Not on file   Social Determinants of Health   Financial Resource Strain: Not on file  Food Insecurity: Not on file  Transportation Needs: Not on file  Physical Activity: Not on file  Stress: Not on file  Social Connections: Not on file  Intimate Partner Violence: Not on file     Allergies  Allergen Reactions   Ziprasidone Hcl Other (See Comments)     CBC    Component Value Date/Time   WBC 9.7 09/15/2022 0836   RBC 3.29 (L) 09/15/2022 0836   HGB 9.9 (L) 09/15/2022 0836   HGB 10.9 (L) 06/19/2013 2103   HCT 28.8 (L) 09/15/2022 0836   HCT 32.4 (L) 06/19/2013 2103   PLT 266 09/15/2022 0836   PLT 250 06/19/2013 2103   MCV 87.5 09/15/2022 0836   MCV 92 06/19/2013 2103   MCH 30.1 09/15/2022 0836   MCHC 34.4 09/15/2022 0836   RDW 12.3 09/15/2022 0836   RDW 13.0 06/19/2013 2103   LYMPHSABS 1.7 05/07/2022 1033   LYMPHSABS 3.3 06/19/2013 2103   MONOABS 0.5 05/07/2022 1033   MONOABS 0.7 06/19/2013 2103   EOSABS 0.1 05/07/2022 1033   EOSABS 0.1 06/19/2013 2103   BASOSABS 0.1 05/07/2022 1033   BASOSABS 0.1 06/19/2013 2103  Pulmonary Functions Testing Results:     No data to display          Outpatient Medications Prior to Visit  Medication Sig Dispense Refill   atorvastatin (LIPITOR) 40 MG tablet Take 40 mg by mouth daily.     omeprazole (PRILOSEC) 40 MG capsule Take 1 capsule by mouth daily.     polyethylene glycol  (MIRALAX / GLYCOLAX) 17 g packet Take 17 g by mouth daily.     ACCU-CHEK AVIVA PLUS test strip 1 each daily.     aluminum-magnesium hydroxide-simethicone (MAALOX) 287-867-67 MG/5ML SUSP Take 30 mLs by mouth 4 (four) times daily -  before meals and at bedtime. 355 mL 0   aspirin 81 MG chewable tablet Chew by mouth.     JARDIANCE 10 MG TABS tablet Take 10 mg by mouth daily.     lisinopril (ZESTRIL) 2.5 MG tablet Take 2.5 mg by mouth daily.     metFORMIN (GLUCOPHAGE-XR) 500 MG 24 hr tablet Take 500 mg by mouth 2 (two) times daily.     methocarbamol (ROBAXIN) 500 MG tablet Take 1 tablet (500 mg total) by mouth 4 (four) times daily. 20 tablet 0   metoCLOPramide (REGLAN) 10 MG tablet Take 1 tablet (10 mg total) by mouth every 6 (six) hours as needed. 30 tablet 0   Misc. Devices (WALKER) MISC 1 each by Does not apply route daily as needed. 1 each 0   pantoprazole (PROTONIX) 20 MG tablet Take 20 mg by mouth daily.     pantoprazole (PROTONIX) 20 MG tablet Take 1 tablet (20 mg total) by mouth 2 (two) times daily. 30 tablet 2   pantoprazole (PROTONIX) 40 MG tablet Take 1 tablet (40 mg total) by mouth daily. 30 tablet 1   Sod Picosulfate-Mag Ox-Cit Acd (CLENPIQ) 10-3.5-12 MG-GM -GM/160ML SOLN Take 1 kit by mouth as directed. At 5 PM evening before procedure, drink 1 bottle of Clenpiq, hydrate, drink (5) 8 oz of water. Then do the same thing 5 hours prior to your procedure. 320 mL 0   sucralfate (CARAFATE) 1 g tablet Take 1 tablet (1 g total) by mouth 4 (four) times daily. (Patient not taking: Reported on 11/07/2021) 120 tablet 1   No facility-administered medications prior to visit.

## 2022-10-02 ENCOUNTER — Ambulatory Visit
Admission: RE | Admit: 2022-10-02 | Discharge: 2022-10-02 | Disposition: A | Discharge status: Home / Self Care | Payer: Medicare Other | Source: Ambulatory Visit | Attending: Internal Medicine | Admitting: Internal Medicine

## 2022-10-02 DIAGNOSIS — R918 Other nonspecific abnormal finding of lung field: Secondary | ICD-10-CM | POA: Diagnosis not present

## 2022-10-15 ENCOUNTER — Telehealth: Payer: Self-pay | Admitting: Student in an Organized Health Care Education/Training Program

## 2022-10-15 NOTE — Progress Notes (Signed)
Called and left detailed message for patient (DPR) letting her know the results of her CT scan.  Advised to call back with any questions.

## 2022-10-15 NOTE — Telephone Encounter (Signed)
Left detailed message for patient (DPR) letting her know the results of her CT scan and advised to call the office with any questions.

## 2022-10-23 ENCOUNTER — Ambulatory Visit
Admission: RE | Admit: 2022-10-23 | Discharge: 2022-10-23 | Disposition: A | Discharge status: Home / Self Care | Payer: Medicare Other | Source: Ambulatory Visit | Attending: Family Medicine | Admitting: Family Medicine

## 2022-10-23 DIAGNOSIS — Z1231 Encounter for screening mammogram for malignant neoplasm of breast: Secondary | ICD-10-CM | POA: Diagnosis present

## 2022-11-09 ENCOUNTER — Other Ambulatory Visit: Payer: Medicare Other

## 2022-11-09 ENCOUNTER — Ambulatory Visit: Payer: Medicare Other | Admitting: Oncology

## 2022-11-10 ENCOUNTER — Ambulatory Visit (INDEPENDENT_AMBULATORY_CARE_PROVIDER_SITE_OTHER): Payer: Medicare Other | Admitting: Student in an Organized Health Care Education/Training Program

## 2022-11-10 ENCOUNTER — Encounter: Payer: Self-pay | Admitting: Student in an Organized Health Care Education/Training Program

## 2022-11-10 VITALS — BP 126/78 | HR 74 | Temp 98.0°F | Ht 66.0 in | Wt 180.0 lb

## 2022-11-10 DIAGNOSIS — R918 Other nonspecific abnormal finding of lung field: Secondary | ICD-10-CM | POA: Diagnosis not present

## 2022-11-10 DIAGNOSIS — R0602 Shortness of breath: Secondary | ICD-10-CM

## 2022-11-10 NOTE — Patient Instructions (Signed)
Reviewing your lung CT scan, the nodules have resolved. The CT does show coronary artery calcifications putting you at an increased risk of coronary artery disease given your diabetes. I would like you to be seen and evaluated by cardiology for that. Finally, I will order a repeat CT scan of the chest for 1 year from today and I will see you for follow up after (in 1 year).

## 2022-11-10 NOTE — Progress Notes (Signed)
Synopsis: Referred in for pulmonary nodules by Ellamae Sia, MD  Assessment & Plan:   1. Pulmonary nodules  Repeat CT scan shows resolution of the pulmonary nodules noted on a CT of the chest from 09/13/2022. The CT from 10/04/2022 does, however, shows peripheral RLL interstitial opacities that were noted on the CT from 2022. On close review of the current CT, it does appear to be in the expiratory phase (given bowing of the posterior membrane) and I suspect that has accentuated their appearance. I will monitor these opacities with a repeat high resolution CT scan of the chest in 1 year.  Finally, the CT does note "heavy coronary artery atherosclerotic calcifications". Given her age, history of type 2 diabetes, and these findings, I will refer her to cardiology for an evaluation for further risk stratification and management.  - Ambulatory referral to Cardiology - CT CHEST HIGH RESOLUTION; Future   Return in about 1 year (around 11/11/2023).  I spent 30 minutes caring for this patient today, including preparing to see the patient, obtaining and/or reviewing separately obtained history, performing a medically appropriate examination and/or evaluation, counseling and educating the patient/family/caregiver, ordering medications, tests, or procedures, documenting clinical information in the electronic health record, and independently interpreting results (not separately reported/billed) and communicating results to the patient/family/caregiver  Armando Reichert, MD [facility] 11/10/2022 10:20 AM    End of visit medications:  No orders of the defined types were placed in this encounter.    Current Outpatient Medications:    ACCU-CHEK AVIVA PLUS test strip, 1 each daily., Disp: , Rfl:    aluminum-magnesium hydroxide-simethicone (MAALOX) 622-297-98 MG/5ML SUSP, Take 30 mLs by mouth 4 (four) times daily -  before meals and at bedtime., Disp: 355 mL, Rfl: 0   aspirin  81 MG chewable tablet, Chew by mouth., Disp: , Rfl:    atorvastatin (LIPITOR) 40 MG tablet, Take 40 mg by mouth daily., Disp: , Rfl:    JARDIANCE 10 MG TABS tablet, Take 10 mg by mouth daily., Disp: , Rfl:    lisinopril (ZESTRIL) 2.5 MG tablet, Take 2.5 mg by mouth daily., Disp: , Rfl:    methocarbamol (ROBAXIN) 500 MG tablet, Take 1 tablet (500 mg total) by mouth 4 (four) times daily., Disp: 20 tablet, Rfl: 0   metoCLOPramide (REGLAN) 10 MG tablet, Take 1 tablet (10 mg total) by mouth every 6 (six) hours as needed., Disp: 30 tablet, Rfl: 0   Misc. Devices (WALKER) MISC, 1 each by Does not apply route daily as needed., Disp: 1 each, Rfl: 0   omeprazole (PRILOSEC) 40 MG capsule, Take 1 capsule by mouth daily., Disp: , Rfl:    pantoprazole (PROTONIX) 20 MG tablet, Take 20 mg by mouth daily., Disp: , Rfl:    polyethylene glycol (MIRALAX / GLYCOLAX) 17 g packet, Take 17 g by mouth daily., Disp: , Rfl:    Sod Picosulfate-Mag Ox-Cit Acd (CLENPIQ) 10-3.5-12 MG-GM -GM/160ML SOLN, Take 1 kit by mouth as directed. At 5 PM evening before procedure, drink 1 bottle of Clenpiq, hydrate, drink (5) 8 oz of water. Then do the same thing 5 hours prior to your procedure., Disp: 320 mL, Rfl: 0   metFORMIN (GLUCOPHAGE-XR) 500 MG 24 hr tablet, Take 500 mg by mouth 2 (two) times daily. (Patient not taking: Reported on 11/10/2022), Disp: , Rfl:    sucralfate (CARAFATE) 1 g tablet, Take 1 tablet (1 g total) by mouth 4 (four) times daily. (Patient not taking: Reported on  11/07/2021), Disp: 120 tablet, Rfl: 1   Subjective:   PATIENT ID: Jennifer Wood GENDER: female DOB: Jan 25, 1951, MRN: [id number]  Chief Complaint  Patient presents with   Follow-up    CT 10/04/2022-Breathing is doing well. No current sx.     HPI  Jennifer Wood is a pleasant 71 year old female who is presenting to clinic for the evaluation of pulmonary nodules.  She was seen 09/24/2022 for an initial visit, where she was asymptomatic. I had ordered a repeat  CT scan of the chest given the index CT was from a year prior. She is here to discuss the results of said CT.   She has no symptoms and no complaints. She has no shortness of breath, no chest pain, no chest tightness, no wheezing, no cough, no sputum production, and no hemoptysis. Similarly, she denies fevers, chills, night sweats, weight loss, rashes, vision changes, swallowing difficulty, runny nose, or abdominal pain. She reports having a hernia for which she awaits a workup.   She used to work for a [employer] (started at 74, retired at 66). She is a former smoker (reports 5 cigarettes off and on for most of her life, last cigarette was a couple of months ago). She lives with her granddaughter. She has no pets at home and denies having any mold.  Ancillary information including prior medications, full medical/surgical/family/social histories, and PFTs (when available) are listed below and have been reviewed.   Review of Systems  Constitutional:  Negative for chills, fever, malaise/fatigue and weight loss.  HENT:  Negative for congestion, ear discharge, hearing loss and sinus pain.   Eyes:  Negative for blurred vision and double vision.  Respiratory:  Negative for cough, hemoptysis, sputum production, shortness of breath and wheezing.   Cardiovascular:  Negative for chest pain, palpitations, orthopnea and leg swelling.  Skin:  Negative for itching and rash.  Neurological:  Negative for focal weakness.   CT Chest 10/04/2022  EXAM: CT CHEST WITHOUT CONTRAST   TECHNIQUE: Multidetector CT imaging of the chest was performed following the standard protocol without IV contrast.   RADIATION DOSE REDUCTION: This exam was performed according to the departmental dose-optimization program which includes automated exposure control, adjustment of the mA and/or kV according to patient size and/or use of iterative reconstruction technique.   COMPARISON:  09/13/2021   FINDINGS: Cardiovascular:  Mild cardiomegaly and heavy coronary artery atherosclerotic calcifications again noted. There is no evidence of thoracic aortic aneurysm or pericardial effusion.   Mediastinum/Nodes: No enlarged mediastinal or axillary lymph nodes. Thyroid gland, trachea, and esophagus demonstrate no significant findings.   Lungs/Pleura: There has been resolution of bilateral pulmonary nodules since 09/13/2021. Mild central ground-glass opacities are unchanged. Peripheral RIGHT LOWER lobe interstitial opacities are unchanged. There is no evidence of new nodule, mass, consolidation, airspace disease, pleural effusion or pneumothorax.   Upper Abdomen: No acute abnormality.   Musculoskeletal: No acute or suspicious bony abnormalities are noted. Remote LEFT rib fractures are noted.   IMPRESSION: 1. Resolution of bilateral pulmonary nodules since 09/13/2021. No further imaging follow-up recommended. 2. Unchanged mild central ground-glass opacities and peripheral RIGHT LOWER lobe interstitial opacities. 3. Cardiomegaly and coronary artery disease.   Objective:   Vitals:   11/10/22 0945  BP: 126/78  Pulse: 74  Temp: 98 F (36.7 C)  TempSrc: Temporal  SpO2: 97%  Weight: 180 lb (81.6 kg)  Height: 5' 6" (1.676 m)   97% on RA  BMI Readings from Last 3 Encounters:  11/10/22 29.05 kg/m  09/24/22 29.18 kg/m  09/15/22 28.82 kg/m   Wt Readings from Last 3 Encounters:  11/10/22 180 lb (81.6 kg)  09/24/22 180 lb 12.8 oz (82 kg)  09/15/22 178 lb 9.2 oz (81 kg)    Physical Exam Constitutional:      Appearance: She is obese.  HENT:     Head: Normocephalic.     Mouth/Throat:     Mouth: Mucous membranes are moist.  Eyes:     Extraocular Movements: Extraocular movements intact.  Cardiovascular:     Rate and Rhythm: Normal rate and regular rhythm.     Pulses: Normal pulses.     Heart sounds: Normal heart sounds.  Pulmonary:     Breath sounds: Normal breath sounds.  Abdominal:      General: There is distension.     Palpations: Abdomen is soft.  Musculoskeletal:     Cervical back: Normal range of motion and neck supple.  Neurological:     General: No focal deficit present.     Mental Status: She is alert. Mental status is at baseline.       Ancillary Information    Past Medical History:  Diagnosis Date   Anemia    Cataract    right eye getting it fixed 06/11/2021   Depression    Diabetes mellitus without complication (HCC)    GERD (gastroesophageal reflux disease)    Gout    Hypercholesteremia    Hypertension    Monoclonal gammopathy    Schizophrenia ([place])    pt denies this dx   Stroke [facility])      Family History  Problem Relation Age of Onset   Diabetes Sister    Breast cancer Neg Hx      Past Surgical History:  Procedure Laterality Date   CATARACT EXTRACTION W/PHACO Left 03/11/2016   Procedure: CATARACT EXTRACTION PHACO AND INTRAOCULAR LENS PLACEMENT ([place]);  Surgeon: Leandrew Koyanagi, MD;  Location: [place];  Service: Ophthalmology;  Laterality: Left;  DIABETIC - insulin   COLONOSCOPY WITH PROPOFOL N/A 01/26/2022   Procedure: COLONOSCOPY WITH PROPOFOL;  Surgeon: [name], MD;  Location[facility] ENDOSCOPY;  Service: Endoscopy;  Laterality: N/A;   ESOPHAGOGASTRODUODENOSCOPY N/A 01/26/2022   Procedure: ESOPHAGOGASTRODUODENOSCOPY (EGD);  Surgeon: [name], MD;  Location[facility]OSCOPY;  Service: Endoscopy;  Laterality: N/A;   ORIF TIBIA PLATEAU Right 09/05/06   [facility]   TUBAL LIGATION      Social History   Socioeconomic History   Marital status: Divorced    Spouse name: Not on file   Number of children: Not on file   Years of education: Not on file   Highest education level: Not on file  Occupational History   Not on file  Tobacco Use   Smoking status: Former    Packs/day: 0.50    Years: 5.00    Total pack years: 2.50    Types: Cigarettes    Quit date: 2021    Years since quitting: 2.8   Smokeless tobacco: Former     Types: Snuff   Tobacco comments:    Quit 2 years ago. At her heaviest she smoked 2 packs a week.  Vaping Use   Vaping Use: Never used  Substance and Sexual Activity   Alcohol use: Yes    Alcohol/week: 1.0 standard drink of alcohol    Types: 1 Cans of beer per week    Comment: rarely   Drug use: No   Sexual activity: Never  Other Topics  Concern   Not on file  Social History Narrative   Not on file   Social Determinants of Health   Financial Resource Strain: Not on file  Food Insecurity: Not on file  Transportation Needs: Not on file  Physical Activity: Not on file  Stress: Not on file  Social Connections: Not on file  Intimate Partner Violence: Not on file     Allergies  Allergen Reactions   Ziprasidone Hcl Other (See Comments)     CBC    Component Value Date/Time   WBC 9.7 09/15/2022 0836   RBC 3.29 (L) 09/15/2022 0836   HGB 9.9 (L) 09/15/2022 0836   HGB 10.9 (L) 06/19/2013 2103   HCT 28.8 (L) 09/15/2022 0836   HCT 32.4 (L) 06/19/2013 2103   PLT 266 09/15/2022 0836   PLT 250 06/19/2013 2103   MCV 87.5 09/15/2022 0836   MCV 92 06/19/2013 2103   MCH 30.1 09/15/2022 0836   MCHC 34.4 09/15/2022 0836   RDW 12.3 09/15/2022 0836   RDW 13.0 06/19/2013 2103   LYMPHSABS 1.7 05/07/2022 1033   LYMPHSABS 3.3 06/19/2013 2103   MONOABS 0.5 05/07/2022 1033   MONOABS 0.7 06/19/2013 2103   EOSABS 0.1 05/07/2022 1033   EOSABS 0.1 06/19/2013 2103   BASOSABS 0.1 05/07/2022 1033   BASOSABS 0.1 06/19/2013 2103    Pulmonary Functions Testing Results:     No data to display          Outpatient Medications Prior to Visit  Medication Sig Dispense Refill   ACCU-CHEK AVIVA PLUS test strip 1 each daily.     aluminum-magnesium hydroxide-simethicone (MAALOX) 161-096-04 MG/5ML SUSP Take 30 mLs by mouth 4 (four) times daily -  before meals and at bedtime. 355 mL 0   aspirin 81 MG chewable tablet Chew by mouth.     atorvastatin (LIPITOR) 40 MG tablet Take 40 mg by mouth daily.      JARDIANCE 10 MG TABS tablet Take 10 mg by mouth daily.     lisinopril (ZESTRIL) 2.5 MG tablet Take 2.5 mg by mouth daily.     methocarbamol (ROBAXIN) 500 MG tablet Take 1 tablet (500 mg total) by mouth 4 (four) times daily. 20 tablet 0   metoCLOPramide (REGLAN) 10 MG tablet Take 1 tablet (10 mg total) by mouth every 6 (six) hours as needed. 30 tablet 0   Misc. Devices (WALKER) MISC 1 each by Does not apply route daily as needed. 1 each 0   omeprazole (PRILOSEC) 40 MG capsule Take 1 capsule by mouth daily.     pantoprazole (PROTONIX) 20 MG tablet Take 20 mg by mouth daily.     polyethylene glycol (MIRALAX / GLYCOLAX) 17 g packet Take 17 g by mouth daily.     Sod Picosulfate-Mag Ox-Cit Acd (CLENPIQ) 10-3.5-12 MG-GM -GM/160ML SOLN Take 1 kit by mouth as directed. At 5 PM evening before procedure, drink 1 bottle of Clenpiq, hydrate, drink (5) 8 oz of water. Then do the same thing 5 hours prior to your procedure. 320 mL 0   metFORMIN (GLUCOPHAGE-XR) 500 MG 24 hr tablet Take 500 mg by mouth 2 (two) times daily. (Patient not taking: Reported on 11/10/2022)     sucralfate (CARAFATE) 1 g tablet Take 1 tablet (1 g total) by mouth 4 (four) times daily. (Patient not taking: Reported on 11/07/2021) 120 tablet 1   pantoprazole (PROTONIX) 20 MG tablet Take 1 tablet (20 mg total) by mouth 2 (two) times daily. 30 tablet 2  pantoprazole (PROTONIX) 40 MG tablet Take 1 tablet (40 mg total) by mouth daily. 30 tablet 1   No facility-administered medications prior to visit.

## 2022-11-27 ENCOUNTER — Encounter: Payer: Self-pay | Admitting: Oncology

## 2022-12-15 ENCOUNTER — Encounter: Payer: Self-pay | Admitting: Oncology

## 2022-12-15 ENCOUNTER — Inpatient Hospital Stay (HOSPITAL_BASED_OUTPATIENT_CLINIC_OR_DEPARTMENT_OTHER): Payer: Medicare Other | Admitting: Oncology

## 2022-12-15 ENCOUNTER — Other Ambulatory Visit: Payer: Self-pay | Admitting: Oncology

## 2022-12-15 ENCOUNTER — Inpatient Hospital Stay: Payer: Medicare Other | Attending: Oncology

## 2022-12-15 VITALS — BP 165/83 | HR 66 | Temp 97.3°F | Resp 18 | Wt 183.0 lb

## 2022-12-15 DIAGNOSIS — Z862 Personal history of diseases of the blood and blood-forming organs and certain disorders involving the immune mechanism: Secondary | ICD-10-CM

## 2022-12-15 DIAGNOSIS — R5383 Other fatigue: Secondary | ICD-10-CM | POA: Insufficient documentation

## 2022-12-15 DIAGNOSIS — N189 Chronic kidney disease, unspecified: Secondary | ICD-10-CM | POA: Diagnosis not present

## 2022-12-15 DIAGNOSIS — E611 Iron deficiency: Secondary | ICD-10-CM | POA: Insufficient documentation

## 2022-12-15 DIAGNOSIS — Z79899 Other long term (current) drug therapy: Secondary | ICD-10-CM | POA: Insufficient documentation

## 2022-12-15 DIAGNOSIS — D472 Monoclonal gammopathy: Secondary | ICD-10-CM

## 2022-12-15 DIAGNOSIS — F17210 Nicotine dependence, cigarettes, uncomplicated: Secondary | ICD-10-CM | POA: Insufficient documentation

## 2022-12-15 DIAGNOSIS — Z833 Family history of diabetes mellitus: Secondary | ICD-10-CM | POA: Insufficient documentation

## 2022-12-15 DIAGNOSIS — Z8673 Personal history of transient ischemic attack (TIA), and cerebral infarction without residual deficits: Secondary | ICD-10-CM | POA: Diagnosis not present

## 2022-12-15 DIAGNOSIS — D631 Anemia in chronic kidney disease: Secondary | ICD-10-CM | POA: Diagnosis present

## 2022-12-15 DIAGNOSIS — Z87891 Personal history of nicotine dependence: Secondary | ICD-10-CM | POA: Diagnosis not present

## 2022-12-15 LAB — CBC
HCT: 27.4 % — ABNORMAL LOW (ref 36.0–46.0)
Hemoglobin: 9.1 g/dL — ABNORMAL LOW (ref 12.0–15.0)
MCH: 29.7 pg (ref 26.0–34.0)
MCHC: 33.2 g/dL (ref 30.0–36.0)
MCV: 89.5 fL (ref 80.0–100.0)
Platelets: 271 10*3/uL (ref 150–400)
RBC: 3.06 MIL/uL — ABNORMAL LOW (ref 3.87–5.11)
RDW: 12.7 % (ref 11.5–15.5)
WBC: 7.3 10*3/uL (ref 4.0–10.5)
nRBC: 0 % (ref 0.0–0.2)

## 2022-12-15 LAB — IRON AND TIBC
Iron: 68 ug/dL (ref 28–170)
Saturation Ratios: 27 % (ref 10.4–31.8)
TIBC: 253 ug/dL (ref 250–450)
UIBC: 185 ug/dL

## 2022-12-15 LAB — FERRITIN: Ferritin: 73 ng/mL (ref 11–307)

## 2022-12-15 NOTE — Progress Notes (Signed)
Pt in for follow up today, reports has had some dizziness but improving.

## 2022-12-15 NOTE — Progress Notes (Signed)
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  She has now been referred to reestablish care.   Results of bloodwork from 03/09/2017 were as follows: CBC showed white count of 9.6, H&H of 10.3/29.9 and a platelet count of 301. CMP was significant for BUN of 39 and creatinine of 1.56. Reticulocyte count was 1.4% and low for the degree of anemia. Ferritin was normal at 51 and iron studies are within normal limits. B12 and folate were within normal limits. TSH was normal at 1.49. Haptoglobin was normal at 187. ESR was elevated at 70. Multiple myeloma panel showed elevated IgG of 1852. Serum monoclonal protein of 0.7 g was noted. Lambda light chain specificity. LDH was normal at 135 and beta-2 microglobulin was elevated at 4.2. 24-hour urine protein revealed 1gm proteinuria and 39 mg monoclonal protein in 24 hours.    Bone marrow biopsy on 04/20/17 showed normocellular marrow with trilineage hematopoiesis and mild increase in plasma cells 9% (5-10%)   Most recent labs from 12/16/2020 showed white count of 7.5, H&H of 10.6/31.9 with a platelet count of 288.  Serum creatinine was elevated at 1.5.  Serum calcium was normal at 9.4 and total protein normal at 7.1.  M spike was 0.9 on serum protein electrophoresis.  Both kappa and lambda free light chains were elevated with a normal free light chain ratio likely secondary to CKD    Interval  history-patient reports chronic fatigue.  No recent hospitalizations.  ECOG PS- 2 Pain scale- 0   Review of systems- Review of Systems  Constitutional:  Positive for malaise/fatigue. Negative for chills, fever and weight loss.  HENT:  Negative for congestion, ear discharge and nosebleeds.   Eyes:  Negative for blurred vision.  Respiratory:  Negative for cough, hemoptysis, sputum production, shortness of breath and wheezing.   Cardiovascular:  Negative for chest pain, palpitations, orthopnea and claudication.  Gastrointestinal:  Negative for abdominal pain, blood in stool, constipation, diarrhea, heartburn, melena, nausea and vomiting.  Genitourinary:  Negative for dysuria, flank pain, frequency, hematuria and urgency.  Musculoskeletal:  Negative for back pain, joint pain and myalgias.  Skin:  Negative for rash.  Neurological:  Negative for dizziness, tingling, focal weakness, seizures, weakness and headaches.  Endo/Heme/Allergies:  Does not bruise/bleed easily.  Psychiatric/Behavioral:  Negative for depression and suicidal ideas. The patient does not have insomnia.       Allergies  Allergen Reactions   Ziprasidone Hcl Other (See Comments)     Past Medical History:  Diagnosis Date   Anemia    Cataract    right eye getting it fixed 06/11/2021   Depression    Diabetes mellitus without complication (HCC)    GERD (gastroesophageal reflux disease)    Gout    Hypercholesteremia    Hypertension    Monoclonal gammopathy    Schizophrenia ([place])    pt denies this dx   Stroke [facility])      Past Surgical  History:  Procedure Laterality Date   CATARACT EXTRACTION W/PHACO Left 03/11/2016   Procedure: CATARACT EXTRACTION PHACO AND INTRAOCULAR LENS PLACEMENT ([facility]);  Surgeon: Leandrew Koyanagi, MD;  Location: [place];  Service: Ophthalmology;  Laterality: Left;  DIABETIC - insulin   COLONOSCOPY WITH PROPOFOL N/A 01/26/2022   Procedure: COLONOSCOPY WITH PROPOFOL;  Surgeon: [name], MD;  Location: [facility] ENDOSCOPY;  Service: Endoscopy;  Laterality: N/A;   ESOPHAGOGASTRODUODENOSCOPY N/A 01/26/2022   Procedure: ESOPHAGOGASTRODUODENOSCOPY (EGD);  Surgeon: [name], MD;  Location: [facility] ENDOSCOPY;  Service: Endoscopy;  Laterality: N/A;   ORIF TIBIA PLATEAU Right 09/05/06   [facility]   TUBAL LIGATION      Social History   Socioeconomic History   Marital status: Divorced    Spouse name: Not on file   Number of children: Not on file   Years of education: Not on file   Highest education level: Not on file  Occupational History   Not on file  Tobacco Use   Smoking status: Former    Packs/day: 0.50    Years: 5.00    Total pack years: 2.50    Types: Cigarettes    Quit date: 2021    Years since quitting: 2.9   Smokeless tobacco: Former    Types: Snuff   Tobacco comments:    Quit 2 years ago. At her heaviest she smoked 2 packs a week.  Vaping Use   Vaping Use: Never used  Substance and Sexual Activity   Alcohol use: Yes    Alcohol/week: 1.0 standard drink of alcohol    Types: 1 Cans of beer per week    Comment: rarely   Drug use: No   Sexual activity: Never  Other Topics Concern   Not on file  Social History Narrative   Not on file   Social Determinants of Health   Financial Resource Strain: Not on file  Food Insecurity: Not on file  Transportation Needs: Not on file  Physical Activity: Not on file  Stress: Not on file  Social Connections: Not on file  Intimate Partner Violence: Not on file    Family History  Problem Relation Age of Onset   Diabetes Sister    Breast cancer Neg Hx      Current Outpatient Medications:    ACCU-CHEK AVIVA PLUS test strip, 1 each daily., Disp: , Rfl:    aluminum-magnesium hydroxide-simethicone (MAALOX) 115-520-80 MG/5ML SUSP, Take 30 mLs by mouth 4 (four) times daily -  before meals and at bedtime., Disp: 355 mL, Rfl: 0   aspirin 81 MG chewable tablet, Chew by mouth., Disp: , Rfl:    atorvastatin (LIPITOR) 40 MG  tablet, Take 40 mg by mouth daily., Disp: , Rfl:    JARDIANCE 10 MG TABS tablet, Take 10 mg by mouth daily., Disp: , Rfl:    LANTUS SOLOSTAR 100 UNIT/ML Solostar Pen, Inject 10 Units into the skin at bedtime., Disp: , Rfl:    Misc. Devices (WALKER) MISC, 1 each by Does not apply route daily as needed., Disp: 1 each, Rfl: 0   omeprazole (PRILOSEC) 40 MG capsule, Take 1 capsule by mouth daily., Disp: , Rfl:    polyethylene glycol (MIRALAX / GLYCOLAX) 17 g packet, Take 17 g by mouth daily., Disp: , Rfl:    lisinopril (ZESTRIL) 2.5 MG tablet, Take 2.5 mg by mouth daily. (Patient not taking: Reported on 12/15/2022), Disp: , Rfl:    methocarbamol (ROBAXIN) 500 MG tablet, Take 1 tablet (500 mg total) by mouth  4 (four) times daily. (Patient not taking: Reported on 12/15/2022), Disp: 20 tablet, Rfl: 0   Sod Picosulfate-Mag Ox-Cit Acd (CLENPIQ) 10-3.5-12 MG-GM -GM/160ML SOLN, Take 1 kit by mouth as directed. At 5 PM evening before procedure, drink 1 bottle of Clenpiq, hydrate, drink (5) 8 oz of water. Then do the same thing 5 hours prior to your procedure. (Patient not taking: Reported on 12/15/2022), Disp: 320 mL, Rfl: 0  Physical exam:  Vitals:   12/15/22 1042  BP: (!) 165/83  Pulse: 66  Resp: 18  Temp: (!) 97.3 F (36.3 C)  TempSrc: Tympanic  SpO2: 99%  Weight: 183 lb (83 kg)   Physical Exam Cardiovascular:     Rate and Rhythm: Normal rate and regular rhythm.     Heart sounds: Normal heart sounds.  Pulmonary:     Effort: Pulmonary effort is normal.     Breath sounds: Normal breath sounds.  Abdominal:     General: Bowel sounds are normal.     Palpations: Abdomen is soft.  Skin:    General: Skin is warm and dry.  Neurological:     Mental Status: She is alert and oriented to person, place, and time.         Latest Ref Rng & Units 02/03/2022    4:58 PM  CMP  Glucose 70 - 99 mg/dL 290   BUN 8 - 23 mg/dL 77   Creatinine 0.44 - 1.00 mg/dL 2.85   Sodium 135 - 145 mmol/L 135   Potassium  3.5 - 5.1 mmol/L 4.3   Chloride 98 - 111 mmol/L 107   CO2 22 - 32 mmol/L 22   Calcium 8.9 - 10.3 mg/dL 8.5   Total Protein 6.5 - 8.1 g/dL 7.3   Total Bilirubin 0.3 - 1.2 mg/dL 0.4   Alkaline Phos 38 - 126 U/L 81   AST 15 - 41 U/L 15   ALT 0 - 44 U/L 11       Latest Ref Rng & Units 12/15/2022   10:06 AM  CBC  WBC 4.0 - 10.5 K/uL 7.3   Hemoglobin 12.0 - 15.0 g/dL 9.1   Hematocrit 36.0 - 46.0 % 27.4   Platelets 150 - 400 K/uL 271     Assessment and plan- Patient is a 71 y.o. female here for routineFollow-up of anemia   Patient's hemoglobin has slowly drifted down and is presently to 9.1 today.  Her hemoglobin was closer to 10 up until last year.  Iron studies from today are pending.  If ferritin is less than 100 I will plan to give her a trial of IV iron given that she has underlying chronic kidney disease and we would like to keep ferritin close to 200.  I will repeat ferritin and iron studies and CBC in 3 months time.  If hemoglobin remains less than 10 despite IV iron I will consider starting EPO at that time  IgG kappa MGUS M protein based on her numbers in September 2023 were stable with an M protein of 0.8 g.  I am checking her free light chain ratio and myeloma panel in 3 months time.    Visit Diagnosis 1. History of anemia due to chronic kidney disease   2. MGUS (monoclonal gammopathy of unknown significance)      Dr. Randa Evens, MD, MPH [facility] [phone] 12/15/2022 12:20 PM

## 2022-12-28 ENCOUNTER — Encounter: Payer: Self-pay | Admitting: Oncology

## 2022-12-30 MED FILL — Ferumoxytol Inj 510 MG/17ML (30 MG/ML) (Elemental Fe): INTRAVENOUS | Qty: 17 | Status: AC

## 2022-12-31 ENCOUNTER — Inpatient Hospital Stay: Payer: 59 | Attending: Oncology

## 2022-12-31 DIAGNOSIS — D472 Monoclonal gammopathy: Secondary | ICD-10-CM | POA: Insufficient documentation

## 2022-12-31 DIAGNOSIS — E538 Deficiency of other specified B group vitamins: Secondary | ICD-10-CM | POA: Insufficient documentation

## 2022-12-31 DIAGNOSIS — E611 Iron deficiency: Secondary | ICD-10-CM | POA: Insufficient documentation

## 2022-12-31 DIAGNOSIS — Z79899 Other long term (current) drug therapy: Secondary | ICD-10-CM | POA: Insufficient documentation

## 2022-12-31 DIAGNOSIS — D631 Anemia in chronic kidney disease: Secondary | ICD-10-CM | POA: Insufficient documentation

## 2022-12-31 DIAGNOSIS — N189 Chronic kidney disease, unspecified: Secondary | ICD-10-CM | POA: Insufficient documentation

## 2023-01-01 ENCOUNTER — Ambulatory Visit: Payer: Medicare Other | Admitting: Cardiology

## 2023-01-01 ENCOUNTER — Encounter: Payer: Self-pay | Admitting: Cardiology

## 2023-01-01 ENCOUNTER — Ambulatory Visit: Payer: Medicare Other | Attending: Cardiology | Admitting: Cardiology

## 2023-01-01 VITALS — BP 136/62 | HR 74 | Ht 66.0 in | Wt 179.2 lb

## 2023-01-01 DIAGNOSIS — E785 Hyperlipidemia, unspecified: Secondary | ICD-10-CM

## 2023-01-01 DIAGNOSIS — I2510 Atherosclerotic heart disease of native coronary artery without angina pectoris: Secondary | ICD-10-CM

## 2023-01-01 DIAGNOSIS — R072 Precordial pain: Secondary | ICD-10-CM | POA: Diagnosis not present

## 2023-01-01 DIAGNOSIS — R079 Chest pain, unspecified: Secondary | ICD-10-CM | POA: Diagnosis not present

## 2023-01-01 NOTE — Patient Instructions (Signed)
Medication Instructions:   Your physician recommends that you continue on your current medications as directed. Please refer to the Current Medication list given to you today.  *If you need a refill on your cardiac medications before your next appointment, please call your pharmacy*   Lab Work:  Your physician recommends that you return for lab when you have your ECHO or Myoview at the medical mall. You will need to be fasting.  No appt is needed. Hours are M-F 7AM- 6 PM.  If you have labs (blood work) drawn today and your tests are completely normal, you will receive your results only by: [place] (if you have MyChart) OR A paper copy in the mail If you have any lab test that is abnormal or we need to change your treatment, we will call you to review the results.   Testing/Procedures:  Echocardiogram   Your physician has requested that you have an echocardiogram. Echocardiography is a painless test that uses sound waves to create images of your heart. It provides your doctor with information about the size and shape of your heart and how well your heart's chambers and valves are working. This procedure takes approximately one hour. There are no restrictions for this procedure. Please note; depending on visual quality an IV may need to be placed.   2. [place]  Your caregiver has ordered a Stress Test with nuclear imaging. The purpose of this test is to evaluate the blood supply to your heart muscle. This procedure is referred to as a "Non-Invasive Stress Test." This is because other than having an IV started in your vein, nothing is inserted or "invades" your body. Cardiac stress tests are done to find areas of poor blood flow to the heart by determining the extent of coronary artery disease (CAD). Some patients exercise on a treadmill, which naturally increases the blood flow to your heart, while others who are  unable to walk on a treadmill due to physical limitations have a  pharmacologic/chemical stress agent called Lexiscan . This medicine will mimic walking on a treadmill by temporarily increasing your coronary blood flow.   Please note: these test may take anywhere between 2-4 hours to complete  PLEASE REPORT TO [place] AT THE FIRST DESK WILL DIRECT YOU WHERE TO GO  Date of Procedure:_____________________________________  Arrival Time for Procedure:______________________________   PLEASE NOTIFY THE OFFICE AT LEAST 24 HOURS IN ADVANCE IF YOU ARE UNABLE TO KEEP YOUR APPOINTMENT.  [phone] AND  PLEASE NOTIFY NUCLEAR MEDICINE AT [facility] AT LEAST 24 HOURS IN ADVANCE IF YOU ARE UNABLE TO KEEP YOUR APPOINTMENT. [phone]  How to prepare for your Myoview test:  Do not eat or drink after midnight No caffeine for 24 hours prior to test No smoking 24 hours prior to test. Your medication may be taken with water.  If your doctor stopped a medication because of this test, do not take that medication. Ladies, please do not wear dresses.  Skirts or pants are appropriate. Please wear a short sleeve shirt. No perfume, cologne or lotion. Wear comfortable walking shoes. No heels!    Follow-Up: At [facility], you and your health needs are our priority.  As part of our continuing mission to provide you with exceptional heart care, we have created designated Provider Care Teams.  These Care Teams include your primary Cardiologist (physician) and Advanced Practice Providers (APPs -  Physician Assistants and Nurse Practitioners) who all work together to provide  you with the care you need, when you need it.  We recommend signing up for the patient portal called "MyChart".  Sign up information is provided on this After Visit Summary.  MyChart is used to connect with patients for Virtual Visits (Telemedicine).  Patients are able to view lab/test results, encounter notes, upcoming appointments, etc.  Non-urgent messages can be sent  to your provider as well.   To learn more about what you can do with MyChart, go to [email or web address].    Your next appointment:    After testing  The format for your next appointment:   In Person  Provider:   You may see Kate Sable, MD or one of the following Advanced Practice Providers on your designated Care Team:   Murray Hodgkins, NP Christell Faith, PA-C Cadence Kathlen Mody, PA-C Gerrie Nordmann, NP

## 2023-01-01 NOTE — Progress Notes (Signed)
Cardiology Office Note:    Date:  01/01/2023   ID:  Jennifer Wood, DOB 1951-12-27, MRN [id number]  PCP:  [facility] Cardiologist:  Kate Sable, MD     Referring MD: Armando Reichert, MD   Chief Complaint  Patient presents with   Other    Pulm. Nodules/sob . Meds reviewed verbally with pt.    History of Present Illness:    Jennifer Wood is a 72 y.o. female with a hx of CAD (3V cor Ca2+ on chest CT), hypertension, hyperlipidemia, diabetes, CKD, GERD, hiatal hernia, who presented due to CAD/coronary calcifications.  Patient has a diagnosis of pulmonary nodules, had a follow-up chest CT 09/2022 which showed heavy three-vessel coronary calcifications.  She states having epigastric chest pain related to hiatal hernia.  Denies shortness of breath, denies family history of heart disease.  Takes lisinopril due to CKD.  Denies history of hypertension.  Past Medical History:  Diagnosis Date   Anemia    Cataract    right eye getting it fixed 06/11/2021   Chronic kidney disease    Depression    Diabetes mellitus without complication (HCC)    GERD (gastroesophageal reflux disease)    Gout    Hypercholesteremia    Hypertension    Monoclonal gammopathy    Schizophrenia ([place])    pt denies this dx   Stroke [facility])     Past Surgical History:  Procedure Laterality Date   CATARACT EXTRACTION W/PHACO Left 03/11/2016   Procedure: CATARACT EXTRACTION PHACO AND INTRAOCULAR LENS PLACEMENT ([place]);  Surgeon: Leandrew Koyanagi, MD;  Location: [facility];  Service: Ophthalmology;  Laterality: Left;  DIABETIC - insulin   COLONOSCOPY WITH PROPOFOL N/A 01/26/2022   Procedure: COLONOSCOPY WITH PROPOFOL;  Surgeon: [name], MD;  Location: [facility] ENDOSCOPY;  Service: Endoscopy;  Laterality: N/A;   ESOPHAGOGASTRODUODENOSCOPY N/A 01/26/2022   Procedure: ESOPHAGOGASTRODUODENOSCOPY (EGD);  Surgeon: [name], MD;  Location: [facility] ENDOSCOPY;   Service: Endoscopy;  Laterality: N/A;   ORIF TIBIA PLATEAU Right 09/05/06   [facility]   TUBAL LIGATION      Current Medications: Current Meds  Medication Sig   ACCU-CHEK AVIVA PLUS test strip 1 each daily.   aluminum-magnesium hydroxide-simethicone (MAALOX) 932-355-73 MG/5ML SUSP Take 30 mLs by mouth 4 (four) times daily -  before meals and at bedtime.   aspirin 81 MG chewable tablet Chew by mouth.   atorvastatin (LIPITOR) 40 MG tablet Take 40 mg by mouth daily.   JARDIANCE 10 MG TABS tablet Take 10 mg by mouth daily.   LANTUS SOLOSTAR 100 UNIT/ML Solostar Pen Inject 10 Units into the skin at bedtime.   Misc. Devices (WALKER) MISC 1 each by Does not apply route daily as needed.   omeprazole (PRILOSEC) 40 MG capsule Take 1 capsule by mouth daily.     Allergies:   Ziprasidone hcl   Social History   Socioeconomic History   Marital status: Divorced    Spouse name: Not on file   Number of children: Not on file   Years of education: Not on file   Highest education level: Not on file  Occupational History   Not on file  Tobacco Use   Smoking status: Former    Packs/day: 0.50    Years: 5.00    Total pack years: 2.50    Types: Cigarettes    Quit date: 2021    Years since quitting: 3.0   Smokeless tobacco: Former  Types: Snuff   Tobacco comments:    Quit 2 years ago. At her heaviest she smoked 2 packs a week.  Vaping Use   Vaping Use: Never used  Substance and Sexual Activity   Alcohol use: Yes    Alcohol/week: 1.0 standard drink of alcohol    Types: 1 Cans of beer per week    Comment: rarely   Drug use: No   Sexual activity: Never  Other Topics Concern   Not on file  Social History Narrative   Not on file   Social Determinants of Health   Financial Resource Strain: Not on file  Food Insecurity: Not on file  Transportation Needs: Not on file  Physical Activity: Not on file  Stress: Not on file  Social Connections: Not on file     Family History: The patient's  family history includes Diabetes in her sister. There is no history of Breast cancer.  ROS:   Please see the history of present illness.     All other systems reviewed and are negative.  EKGs/Labs/Other Studies Reviewed:    The following studies were reviewed today:   EKG:  EKG is  ordered today.  The ekg ordered today demonstrates sinus rhythm, right bundle branch  Recent Labs: 02/03/2022: ALT 11; BUN 77; Creatinine, Ser 2.85; Potassium 4.3; Sodium 135 12/15/2022: Hemoglobin 9.1; Platelets 271  Recent Lipid Panel No results found for: "CHOL", "TRIG", "HDL", "CHOLHDL", "VLDL", "LDLCALC", "LDLDIRECT"   Risk Assessment/Calculations:             Physical Exam:    VS:  BP 136/62 (BP Location: Right Arm, Patient Position: Sitting, Cuff Size: Large)   Pulse 74   Ht '5\' 6"'$  (1.676 m)   Wt 179 lb 4 oz (81.3 kg)   SpO2 96%   BMI 28.93 kg/m     Wt Readings from Last 3 Encounters:  01/01/23 179 lb 4 oz (81.3 kg)  12/15/22 183 lb (83 kg)  11/10/22 180 lb (81.6 kg)     GEN:  Well nourished, well developed in no acute distress HEENT: Normal NECK: No JVD; No carotid bruits CARDIAC: RRR, no murmurs, rubs, gallops RESPIRATORY:  Clear to auscultation without rales, wheezing or rhonchi  ABDOMEN: Soft, non-tender, non-distended MUSCULOSKELETAL:  No edema; No deformity  SKIN: Warm and dry NEUROLOGIC:  Alert and oriented x 3 PSYCHIATRIC:  Normal affect   ASSESSMENT:    1. Coronary artery disease involving native coronary artery of native heart, unspecified whether angina present   2. Hyperlipidemia LDL goal <70   3. Precordial pain   4. Chest pain, unspecified type    PLAN:    In order of problems listed above:  CAD, Three-vessel coronary calcifications, atypical epigastric chest pain.  Continue aspirin, Lipitor 40.  Get echocardiogram, get Lexiscan Myoview. Goal LDL less than 70, obtain fasting lipid profile.  Continue Lipitor 40 mg daily. Epigastric pain, could be related to  GERD/hiatal hernia.  Continue PPI.  Cardiac workup with echo and Myoview as above.  Follow-up after echo and Myoview.      Shared Decision Making/Informed Consent The risks [chest pain, shortness of breath, cardiac arrhythmias, dizziness, blood pressure fluctuations, myocardial infarction, stroke/transient ischemic attack, nausea, vomiting, allergic reaction, radiation exposure, metallic taste sensation and life-threatening complications (estimated to be 1 in 10,000)], benefits (risk stratification, diagnosing coronary artery disease, treatment guidance) and alternatives of a nuclear stress test were discussed in detail with Ms. Peters and she agrees to proceed.    Medication  Adjustments/Labs and Tests Ordered: Current medicines are reviewed at length with the patient today.  Concerns regarding medicines are outlined above.  Orders Placed This Encounter  Procedures   NM Myocar Multi W/Spect W/[place] Motion / EF   Lipid panel   EKG 12-Lead   ECHOCARDIOGRAM COMPLETE   No orders of the defined types were placed in this encounter.   Patient Instructions  Medication Instructions:   Your physician recommends that you continue on your current medications as directed. Please refer to the Current Medication list given to you today.  *If you need a refill on your cardiac medications before your next appointment, please call your pharmacy*   Lab Work:  Your physician recommends that you return for lab when you have your ECHO or Myoview at the medical mall. You will need to be fasting.  No appt is needed. Hours are M-F 7AM- 6 PM.  If you have labs (blood work) drawn today and your tests are completely normal, you will receive your results only by: [place] (if you have MyChart) OR A paper copy in the mail If you have any lab test that is abnormal or we need to change your treatment, we will call you to review the results.   Testing/Procedures:  Echocardiogram   Your physician has  requested that you have an echocardiogram. Echocardiography is a painless test that uses sound waves to create images of your heart. It provides your doctor with information about the size and shape of your heart and how well your heart's chambers and valves are working. This procedure takes approximately one hour. There are no restrictions for this procedure. Please note; depending on visual quality an IV may need to be placed.   2. [place]  Your caregiver has ordered a Stress Test with nuclear imaging. The purpose of this test is to evaluate the blood supply to your heart muscle. This procedure is referred to as a "Non-Invasive Stress Test." This is because other than having an IV started in your vein, nothing is inserted or "invades" your body. Cardiac stress tests are done to find areas of poor blood flow to the heart by determining the extent of coronary artery disease (CAD). Some patients exercise on a treadmill, which naturally increases the blood flow to your heart, while others who are  unable to walk on a treadmill due to physical limitations have a pharmacologic/chemical stress agent called Lexiscan . This medicine will mimic walking on a treadmill by temporarily increasing your coronary blood flow.   Please note: these test may take anywhere between 2-4 hours to complete  PLEASE REPORT TO [place] AT THE FIRST DESK WILL DIRECT YOU WHERE TO GO  Date of Procedure:_____________________________________  Arrival Time for Procedure:______________________________   PLEASE NOTIFY THE OFFICE AT LEAST 24 HOURS IN ADVANCE IF YOU ARE UNABLE TO KEEP YOUR APPOINTMENT.  [phone] AND  PLEASE NOTIFY NUCLEAR MEDICINE AT [facility] AT LEAST 24 HOURS IN ADVANCE IF YOU ARE UNABLE TO KEEP YOUR APPOINTMENT. [phone]  How to prepare for your Myoview test:  Do not eat or drink after midnight No caffeine for 24 hours prior to test No smoking 24 hours prior to  test. Your medication may be taken with water.  If your doctor stopped a medication because of this test, do not take that medication. Ladies, please do not wear dresses.  Skirts or pants are appropriate. Please wear a short sleeve shirt. No perfume, cologne or lotion. Wear  comfortable walking shoes. No heels!    Follow-Up: At [facility], you and your health needs are our priority.  As part of our continuing mission to provide you with exceptional heart care, we have created designated Provider Care Teams.  These Care Teams include your primary Cardiologist (physician) and Advanced Practice Providers (APPs -  Physician Assistants and Nurse Practitioners) who all work together to provide you with the care you need, when you need it.  We recommend signing up for the patient portal called "MyChart".  Sign up information is provided on this After Visit Summary.  MyChart is used to connect with patients for Virtual Visits (Telemedicine).  Patients are able to view lab/test results, encounter notes, upcoming appointments, etc.  Non-urgent messages can be sent to your provider as well.   To learn more about what you can do with MyChart, go to [email or web address].    Your next appointment:    After testing  The format for your next appointment:   In Person  Provider:   You may see Kate Sable, MD or one of the following Advanced Practice Providers on your designated Care Team:   Murray Hodgkins, NP Christell Faith, PA-C Cadence Kathlen Mody, PA-C Gerrie Nordmann, NP       Signed, Kate Sable, MD  01/01/2023 9:09 AM    [place]

## 2023-01-06 ENCOUNTER — Encounter: Payer: Self-pay | Admitting: Oncology

## 2023-01-07 ENCOUNTER — Inpatient Hospital Stay: Payer: 59

## 2023-01-07 VITALS — BP 134/58 | HR 65 | Temp 97.0°F | Resp 16

## 2023-01-07 DIAGNOSIS — N189 Chronic kidney disease, unspecified: Secondary | ICD-10-CM | POA: Diagnosis not present

## 2023-01-07 DIAGNOSIS — E538 Deficiency of other specified B group vitamins: Secondary | ICD-10-CM | POA: Diagnosis not present

## 2023-01-07 DIAGNOSIS — Z79899 Other long term (current) drug therapy: Secondary | ICD-10-CM | POA: Diagnosis not present

## 2023-01-07 DIAGNOSIS — D631 Anemia in chronic kidney disease: Secondary | ICD-10-CM | POA: Diagnosis present

## 2023-01-07 DIAGNOSIS — E611 Iron deficiency: Secondary | ICD-10-CM | POA: Diagnosis not present

## 2023-01-07 DIAGNOSIS — D472 Monoclonal gammopathy: Secondary | ICD-10-CM | POA: Diagnosis not present

## 2023-01-07 MED ORDER — SODIUM CHLORIDE 0.9 % IV SOLN
510.0000 mg | INTRAVENOUS | Status: DC
  Administered 2023-01-07: 510 mg via INTRAVENOUS
  Filled 2023-01-07: qty 510

## 2023-01-07 MED ORDER — SODIUM CHLORIDE 0.9 % IV SOLN
Freq: Once | INTRAVENOUS | Status: AC
  Filled 2023-01-07: qty 250

## 2023-01-07 NOTE — Patient Instructions (Signed)

## 2023-01-08 ENCOUNTER — Encounter
Admission: RE | Admit: 2023-01-08 | Discharge: 2023-01-08 | Disposition: A | Discharge status: Home / Self Care | Payer: 59 | Source: Ambulatory Visit | Attending: Cardiology | Admitting: Cardiology

## 2023-01-08 DIAGNOSIS — R079 Chest pain, unspecified: Secondary | ICD-10-CM | POA: Insufficient documentation

## 2023-01-08 LAB — NM MYOCAR MULTI W/SPECT W/WALL MOTION / EF
LV dias vol: 51 mL (ref 46–106)
LV sys vol: 25 mL
Nuc Stress EF: 51 %
Peak HR: 83 {beats}/min
Rest HR: 65 {beats}/min
SDS: 0
SRS: 7
SSS: 0
ST Depression (mm): 0 mm
TID: 0.92

## 2023-01-08 MED ORDER — TECHNETIUM TC 99M TETROFOSMIN IV KIT
29.9900 | PACK | Freq: Once | INTRAVENOUS | Status: AC | PRN
  Administered 2023-01-08: 29.99 via INTRAVENOUS

## 2023-01-08 MED ORDER — REGADENOSON 0.4 MG/5ML IV SOLN
0.4000 mg | Freq: Once | INTRAVENOUS | Status: AC
  Administered 2023-01-08: 0.4 mg via INTRAVENOUS

## 2023-01-08 MED ORDER — TECHNETIUM TC 99M TETROFOSMIN IV KIT
10.2800 | PACK | Freq: Once | INTRAVENOUS | Status: AC | PRN
  Administered 2023-01-08: 10.28 via INTRAVENOUS

## 2023-01-11 NOTE — Progress Notes (Signed)
Called to review results.  No answer. LMOV to call back.

## 2023-01-13 ENCOUNTER — Inpatient Hospital Stay: Payer: 59

## 2023-01-13 VITALS — BP 164/73 | HR 79 | Temp 97.2°F | Resp 18

## 2023-01-13 DIAGNOSIS — E538 Deficiency of other specified B group vitamins: Secondary | ICD-10-CM

## 2023-01-13 DIAGNOSIS — N189 Chronic kidney disease, unspecified: Secondary | ICD-10-CM | POA: Diagnosis not present

## 2023-01-13 MED ORDER — SODIUM CHLORIDE 0.9 % IV SOLN
510.0000 mg | INTRAVENOUS | Status: AC
  Administered 2023-01-13: 510 mg via INTRAVENOUS
  Filled 2023-01-13: qty 510

## 2023-01-13 MED ORDER — SODIUM CHLORIDE 0.9 % IV SOLN
Freq: Once | INTRAVENOUS | Status: AC
  Filled 2023-01-13: qty 250

## 2023-01-13 NOTE — Patient Instructions (Signed)

## 2023-01-15 ENCOUNTER — Ambulatory Visit: Payer: 59 | Attending: Cardiology

## 2023-01-15 DIAGNOSIS — I2510 Atherosclerotic heart disease of native coronary artery without angina pectoris: Secondary | ICD-10-CM | POA: Diagnosis not present

## 2023-01-15 DIAGNOSIS — R072 Precordial pain: Secondary | ICD-10-CM

## 2023-01-15 LAB — ECHOCARDIOGRAM COMPLETE
AR max vel: 2.78 cm2
AV Area VTI: 2.37 cm2
AV Area mean vel: 2.39 cm2
AV Mean grad: 4 mmHg
AV Peak grad: 5.9 mmHg
Ao pk vel: 1.21 m/s
Area-P 1/2: 2.84 cm2
S' Lateral: 1.9 cm

## 2023-01-26 ENCOUNTER — Encounter: Payer: Self-pay | Admitting: Gastroenterology

## 2023-01-26 ENCOUNTER — Ambulatory Visit (INDEPENDENT_AMBULATORY_CARE_PROVIDER_SITE_OTHER): Payer: 59 | Admitting: Gastroenterology

## 2023-01-26 VITALS — BP 171/85 | HR 76 | Temp 97.8°F | Ht 66.0 in | Wt 179.1 lb

## 2023-01-26 DIAGNOSIS — K449 Diaphragmatic hernia without obstruction or gangrene: Secondary | ICD-10-CM | POA: Diagnosis not present

## 2023-01-26 DIAGNOSIS — K219 Gastro-esophageal reflux disease without esophagitis: Secondary | ICD-10-CM | POA: Diagnosis not present

## 2023-01-26 DIAGNOSIS — R1030 Lower abdominal pain, unspecified: Secondary | ICD-10-CM

## 2023-01-26 NOTE — Patient Instructions (Signed)
Gave Fusion Plus samples and Ibguard samples

## 2023-01-26 NOTE — Progress Notes (Signed)
Jennifer Darby, MD [address]  [place]  [place],  [address]  Main: [phone]  Fax: [phone]    Gastroenterology Consultation  Referring Provider:     Verlin Dike, MD Primary Care Physician:  [facility] Primary Gastroenterologist:  Dr. Cephas Wood Reason for Consultation:     Chronic GERD        HPI:   Jennifer Wood is a 72 y.o. female referred by  [facility]  for consultation & management of chronic GERD.  Patient reports that for last 1 year, she has been experiencing reflux, heartburn, regurgitation.  This started after she was treated for flareup of her gout with prednisone about an year ago.  She is currently on Protonix 20 mg daily.  Patient does report lower abdominal discomfort, gurgling.  She denies rectal bleeding, diarrhea or constipation.  She denies any weight loss.  She does have history of diabetes.  Patient denies difficulty swallowing.  She has anemia of chronic kidney disease  Follow-up visit 01/29/2023 Patient is here to discuss about lower abdominal pressure and bloating.  She thinks she has an hernia.  She reports difficulty moving her bowels.  She takes a stool softener as needed.  She denies any rectal bleeding.  Her acid reflux is under control.  Patient denies any epigastric pain.  She underwent EGD in 12/2021 which was unremarkable except for small hiatal hernia, taking omeprazole 40 mg daily.  Colonoscopy was also unremarkable in 12/2021.  She has anemia secondary to chronic kidney disease and component of iron deficiency  NSAIDs: None  Antiplts/Anticoagulants/Anti thrombotics: None  GI Procedures:  EGD and colonoscopy 01/26/2022 Small hiatal hernia, normal stomach, esophagus and duodenum 2 diminutive polyps in ascending colon, removed with forceps, optimal bowel prep  DIAGNOSIS:  A. COLON POLYP X2, ASCENDING; COLD BIOPSY:  - TUBULAR ADENOMA (2).  - NEGATIVE FOR HIGH-GRADE DYSPLASIA  AND MALIGNANCY.   Past Medical History:  Diagnosis Date   Anemia    Cataract    right eye getting it fixed 06/11/2021   Chronic kidney disease    Depression    Diabetes mellitus without complication (HCC)    GERD (gastroesophageal reflux disease)    Gout    Hypercholesteremia    Hypertension    Monoclonal gammopathy    Schizophrenia ([place])    pt denies this dx   Stroke [facility] )     Past Surgical History:  Procedure Laterality Date   CATARACT EXTRACTION W/PHACO Left 03/11/2016   Procedure: CATARACT EXTRACTION PHACO AND INTRAOCULAR LENS PLACEMENT ([place]);  Surgeon: Leandrew Koyanagi, MD;  Location: [facility];  Service: Ophthalmology;  Laterality: Left;  DIABETIC - insulin   COLONOSCOPY WITH PROPOFOL N/A 01/26/2022   Procedure: COLONOSCOPY WITH PROPOFOL;  Surgeon: [name], MD;  Location: [facility] ENDOSCOPY;  Service: Endoscopy;  Laterality: N/A;   ESOPHAGOGASTRODUODENOSCOPY N/A 01/26/2022   Procedure: ESOPHAGOGASTRODUODENOSCOPY (EGD);  Surgeon: [name], MD;  Location: [facility] ENDOSCOPY;  Service: Endoscopy;  Laterality: N/A;   ORIF TIBIA PLATEAU Right 09/05/06   [facility]   TUBAL LIGATION      Current Outpatient Medications:    ACCU-CHEK AVIVA PLUS test strip, 1 each daily., Disp: , Rfl:    aluminum-magnesium hydroxide-simethicone (MAALOX) 503-888-28 MG/5ML SUSP, Take 30 mLs by mouth 4 (four) times daily -  before meals and at bedtime., Disp: 355 mL, Rfl: 0   aspirin 81 MG chewable tablet, Chew by mouth., Disp: , Rfl:  atorvastatin (LIPITOR) 40 MG tablet, Take 40 mg by mouth daily., Disp: , Rfl:    JARDIANCE 10 MG TABS tablet, Take 10 mg by mouth daily., Disp: , Rfl:    LANTUS SOLOSTAR 100 UNIT/ML Solostar Pen, Inject 10 Units into the skin at bedtime., Disp: , Rfl:    lisinopril (ZESTRIL) 2.5 MG tablet, Take 2.5 mg by mouth daily., Disp: , Rfl:    Misc. Devices (WALKER) MISC, 1 each by Does not apply route daily as needed., Disp: 1 each, Rfl: 0   omeprazole (PRILOSEC) 40 MG  capsule, Take 1 capsule by mouth daily., Disp: , Rfl:  No current facility-administered medications for this visit.  Facility-Administered Medications Ordered in Other Visits:    ferumoxytol (FERAHEME) 510 mg in sodium chloride 0.9 % 100 mL IVPB, 510 mg, Intravenous, Weekly, Sindy Guadeloupe, MD, Last Rate: 468 mL/hr at 01/13/23 1457, 510 mg at 01/13/23 1457    Family History  Problem Relation Age of Onset   Diabetes Sister    Breast cancer Neg Hx      Social History   Tobacco Use   Smoking status: Former    Packs/day: 0.50    Years: 5.00    Total pack years: 2.50    Types: Cigarettes    Quit date: 2021    Years since quitting: 3.0   Smokeless tobacco: Former    Types: Snuff   Tobacco comments:    Quit 2 years ago. At her heaviest she smoked 2 packs a week.  Vaping Use   Vaping Use: Never used  Substance Use Topics   Alcohol use: Yes    Alcohol/week: 1.0 standard drink of alcohol    Types: 1 Cans of beer per week    Comment: rarely   Drug use: No    Allergies as of 01/26/2023 - Review Complete 01/26/2023  Allergen Reaction Noted   Ziprasidone hcl Other (See Comments) 08/05/2020    Review of Systems:    All systems reviewed and negative except where noted in HPI.   Physical Exam:  BP (!) 171/85 (BP Location: Left Arm, Patient Position: Sitting, Cuff Size: Normal)   Pulse 76   Temp 97.8 F (36.6 C) (Oral)   Ht '5\' 6"'$  (1.676 m)   Wt 179 lb 2 oz (81.3 kg)   BMI 28.91 kg/m  No LMP recorded. Patient is postmenopausal.  General:   Alert,  Well-developed, well-nourished, pleasant and cooperative in NAD Head:  Normocephalic and atraumatic. Eyes:  Sclera clear, no icterus.   Conjunctiva pink. Ears:  Normal auditory acuity. Nose:  No deformity, discharge, or lesions. Mouth:  No deformity or lesions,oropharynx pink & moist. Neck:  Supple; no masses or thyromegaly. Lungs:  Respirations even and unlabored.  Clear throughout to auscultation.   No wheezes, crackles, or  rhonchi. No acute distress. Heart:  Regular rate and rhythm; no murmurs, clicks, rubs, or gallops. Abdomen:  Normal bowel sounds. Soft, non-tender and non-distended without masses, hepatosplenomegaly or hernias noted.  No guarding or rebound tenderness.   Rectal: Not performed Msk:  Symmetrical without gross deformities. Good, equal movement & strength bilaterally. Pulses:  Normal pulses noted. Extremities:  No clubbing or edema.  No cyanosis. Neurologic:  Alert and oriented x3;  grossly normal neurologically. Skin:  Intact without significant lesions or rashes. No jaundice. Psych:  Alert and cooperative. Normal mood and affect.  Imaging Studies: Reviewed  Assessment and Plan:   Jennifer Wood is a 72 y.o. female with history of metabolic  syndrome, CKD, anemia of chronic kidney disease is seen in for follow-up of chronic GERD and lower abdominal discomfort  Chronic GERD EGD in 12/2021 revealed small hiatal hernia, otherwise normal exam Continue omeprazole 40 mg once a day Continue antireflux lifestyle  Lower abdominal discomfort CT abdomen and pelvis without contrast in 08/2021 was unremarkable Colonoscopy was unremarkable Advised patient to keep bowels regular, avoid snacking junk foods, cut back on carbonated beverages Trial of IBgard, samples provided  Follow up as needed   Jennifer Darby, MD

## 2023-01-29 ENCOUNTER — Telehealth: Payer: Self-pay

## 2023-01-29 ENCOUNTER — Ambulatory Visit: Payer: 59 | Attending: Cardiology | Admitting: Cardiology

## 2023-01-29 ENCOUNTER — Other Ambulatory Visit
Admission: RE | Admit: 2023-01-29 | Discharge: 2023-01-29 | Disposition: A | Discharge status: Home / Self Care | Payer: 59 | Attending: Cardiology | Admitting: Cardiology

## 2023-01-29 ENCOUNTER — Encounter: Payer: Self-pay | Admitting: Gastroenterology

## 2023-01-29 ENCOUNTER — Encounter: Payer: Self-pay | Admitting: Cardiology

## 2023-01-29 VITALS — BP 122/64 | HR 72 | Ht 66.0 in | Wt 179.0 lb

## 2023-01-29 DIAGNOSIS — I2510 Atherosclerotic heart disease of native coronary artery without angina pectoris: Secondary | ICD-10-CM | POA: Diagnosis not present

## 2023-01-29 DIAGNOSIS — E785 Hyperlipidemia, unspecified: Secondary | ICD-10-CM | POA: Diagnosis present

## 2023-01-29 DIAGNOSIS — K2100 Gastro-esophageal reflux disease with esophagitis, without bleeding: Secondary | ICD-10-CM | POA: Diagnosis not present

## 2023-01-29 LAB — LIPID PANEL
Cholesterol: 165 mg/dL (ref 0–200)
HDL: 36 mg/dL — ABNORMAL LOW (ref >40)
LDL Cholesterol: 85 mg/dL (ref 0–99)
Total CHOL/HDL Ratio: 4.6 RATIO
Triglycerides: 222 mg/dL — ABNORMAL HIGH (ref <150)
VLDL: 44 mg/dL — ABNORMAL HIGH (ref 0–40)

## 2023-01-29 MED ORDER — ATORVASTATIN CALCIUM 80 MG PO TABS: 80.0000 mg | ORAL_TABLET | Freq: Every day | ORAL | 3 refills | Status: DC

## 2023-01-29 NOTE — Patient Instructions (Signed)
Medication Instructions:   Your physician recommends that you continue on your current medications as directed. Please refer to the Current Medication list given to you today.  *If you need a refill on your cardiac medications before your next appointment, please call your pharmacy*   Lab Work:  Your provider recommends you go to the medical mall to have lab work completed.   If you have labs (blood work) drawn today and your tests are completely normal, you will receive your results only by: [place] (if you have MyChart) OR A paper copy in the mail If you have any lab test that is abnormal or we need to change your treatment, we will call you to review the results.   Testing/Procedures:  None Ordered   Follow-Up: At [facility], you and your health needs are our priority.  As part of our continuing mission to provide you with exceptional heart care, we have created designated Provider Care Teams.  These Care Teams include your primary Cardiologist (physician) and Advanced Practice Providers (APPs -  Physician Assistants and Nurse Practitioners) who all work together to provide you with the care you need, when you need it.  We recommend signing up for the patient portal called "MyChart".  Sign up information is provided on this After Visit Summary.  MyChart is used to connect with patients for Virtual Visits (Telemedicine).  Patients are able to view lab/test results, encounter notes, upcoming appointments, etc.  Non-urgent messages can be sent to your provider as well.   To learn more about what you can do with MyChart, go to [email or web address].    Your next appointment:   12 month(s)  Provider:   You may see Kate Sable, MD or one of the following Advanced Practice Providers on your designated Care Team:   Murray Hodgkins, NP Christell Faith, PA-C Cadence Kathlen Mody, PA-C Gerrie Nordmann, NP

## 2023-01-29 NOTE — Telephone Encounter (Signed)
-----   Message from Kate Sable, MD sent at 01/29/2023 12:42 PM EST ----- Triglycerides still elevated, increase Lipitor to 80 mg daily.

## 2023-01-29 NOTE — Progress Notes (Signed)
Cardiology Office Note:    Date:  01/29/2023   ID:  Jennifer Wood, DOB [birth year], MRN [id number]  PCP:  [facility] Cardiologist:  Kate Sable, MD     Referring MD: [facility]*   Chief Complaint  Patient presents with   Follow-up    Testing f/u, has not completed Lipid pt is fasting, no new cardiac concerns     History of Present Illness:    Jennifer Wood is a 72 y.o. female with a hx of CAD (3V cor Ca2+ on chest CT), hyperlipidemia, diabetes, CKD, GERD, hiatal hernia, who presented due to CAD/coronary calcifications.  Patient has epigastric pain, coronary calcifications noted on chest CT.  Takes omeprazole and Maalox which has been helping.  Underwent stress test to evaluate any ischemia.  Echocardiogram also obtained.  Presents for testing results.  Prior notes chest CT 09/2022 which showed heavy three-vessel coronary calcifications.   She states having epigastric chest pain related to hiatal hernia.  Takes lisinopril due to CKD.  Denies history of hypertension.  Past Medical History:  Diagnosis Date   Anemia    Cataract    right eye getting it fixed 06/11/2021   Chronic kidney disease    Depression    Diabetes mellitus without complication (HCC)    GERD (gastroesophageal reflux disease)    Gout    Hypercholesteremia    Hypertension    Monoclonal gammopathy    Schizophrenia ([place])    pt denies this dx   Stroke [facility])     Past Surgical History:  Procedure Laterality Date   CATARACT EXTRACTION W/PHACO Left 03/11/2016   Procedure: CATARACT EXTRACTION PHACO AND INTRAOCULAR LENS PLACEMENT ([place]);  Surgeon: Leandrew Koyanagi, MD;  Location: [place];  Service: Ophthalmology;  Laterality: Left;  DIABETIC - insulin   COLONOSCOPY WITH PROPOFOL N/A 01/26/2022   Procedure: COLONOSCOPY WITH PROPOFOL;  Surgeon: [name], MD;  Location[facility] ENDOSCOPY;  Service: Endoscopy;  Laterality: N/A;    ESOPHAGOGASTRODUODENOSCOPY N/A 01/26/2022   Procedure: ESOPHAGOGASTRODUODENOSCOPY (EGD);  Surgeon: [name], MD;  Location[facility] ENDOSCOPY;  Service: Endoscopy;  Laterality: N/A;   ORIF TIBIA PLATEAU Right 09/05/06   [facility]   TUBAL LIGATION      Current Medications: Current Meds  Medication Sig   ACCU-CHEK AVIVA PLUS test strip 1 each daily.   aspirin 81 MG chewable tablet Chew by mouth.   atorvastatin (LIPITOR) 40 MG tablet Take 40 mg by mouth daily.   JARDIANCE 10 MG TABS tablet Take 10 mg by mouth daily.   LANTUS SOLOSTAR 100 UNIT/ML Solostar Pen Inject 10 Units into the skin at bedtime.   lisinopril (ZESTRIL) 2.5 MG tablet Take 2.5 mg by mouth daily.   Misc. Devices (WALKER) MISC 1 each by Does not apply route daily as needed.   omeprazole (PRILOSEC) 40 MG capsule Take 1 capsule by mouth daily.     Allergies:   Ziprasidone hcl   Social History   Socioeconomic History   Marital status: Divorced    Spouse name: Not on file   Number of children: Not on file   Years of education: Not on file   Highest education level: Not on file  Occupational History   Not on file  Tobacco Use   Smoking status: Former    Packs/day: 0.50    Years: 5.00    Total pack years: 2.50    Types: Cigarettes    Quit date: 2021  Years since quitting: 3.0   Smokeless tobacco: Former    Types: Snuff   Tobacco comments:    Quit 2 years ago. At her heaviest she smoked 2 packs a week.  Vaping Use   Vaping Use: Never used  Substance and Sexual Activity   Alcohol use: Not Currently    Alcohol/week: 1.0 standard drink of alcohol    Types: 1 Cans of beer per week    Comment: rarely   Drug use: No   Sexual activity: Never  Other Topics Concern   Not on file  Social History Narrative   Not on file   Social Determinants of Health   Financial Resource Strain: Not on file  Food Insecurity: Not on file  Transportation Needs: Not on file  Physical Activity: Not on file  Stress: Not on file   Social Connections: Not on file     Family History: The patient's family history includes Diabetes in her sister. There is no history of Breast cancer.  ROS:   Please see the history of present illness.     All other systems reviewed and are negative.  EKGs/Labs/Other Studies Reviewed:    The following studies were reviewed today:   EKG:  EKG not  ordered today.    Recent Labs: 02/03/2022: ALT 11; BUN 77; Creatinine, Ser 2.85; Potassium 4.3; Sodium 135 12/15/2022: Hemoglobin 9.1; Platelets 271  Recent Lipid Panel No results found for: "CHOL", "TRIG", "HDL", "CHOLHDL", "VLDL", "LDLCALC", "LDLDIRECT"   Risk Assessment/Calculations:             Physical Exam:    VS:  BP 122/64 (BP Location: Left Arm, Patient Position: Sitting, Cuff Size: Normal)   Pulse 72   Ht '5\' 6"'$  (1.676 m)   Wt 179 lb (81.2 kg)   SpO2 98%   BMI 28.89 kg/m     Wt Readings from Last 3 Encounters:  01/29/23 179 lb (81.2 kg)  01/26/23 179 lb 2 oz (81.3 kg)  01/01/23 179 lb 4 oz (81.3 kg)     GEN:  Well nourished, well developed in no acute distress HEENT: Normal NECK: No JVD; No carotid bruits CARDIAC: RRR, no murmurs, rubs, gallops RESPIRATORY:  Clear to auscultation without rales, wheezing or rhonchi  ABDOMEN: Soft, non-tender, non-distended MUSCULOSKELETAL:  No edema; No deformity  SKIN: Warm and dry NEUROLOGIC:  Alert and oriented x 3 PSYCHIATRIC:  Normal affect   ASSESSMENT:    1. Coronary artery disease involving native coronary artery of native heart, unspecified whether angina present   2. Hyperlipidemia LDL goal <70   3. Gastroesophageal reflux disease with esophagitis without hemorrhage     PLAN:    In order of problems listed above:  CAD, Three-vessel coronary calcifications, Lexiscan Myoview 1/24 no significant ischemia, echo 1/24 EF 60 to 65%.  Continue aspirin, Lipitor 40.   Goal LDL less than 70, advised to obtain fasting lipid profile.  Continue Lipitor 40 mg  daily. Epigastric pain,  GERD/hiatal hernia.  Continue PPI.  Normal cardiac workup.  Follow-up with PCP regarding management of reflux.  Follow-up in 1 year.     Medication Adjustments/Labs and Tests Ordered: Current medicines are reviewed at length with the patient today.  Concerns regarding medicines are outlined above.  No orders of the defined types were placed in this encounter.  No orders of the defined types were placed in this encounter.   Patient Instructions  Medication Instructions:   Your physician recommends that you continue on your current medications  as directed. Please refer to the Current Medication list given to you today.  *If you need a refill on your cardiac medications before your next appointment, please call your pharmacy*   Lab Work:  Your provider recommends you go to the medical mall to have lab work completed.   If you have labs (blood work) drawn today and your tests are completely normal, you will receive your results only by: [place] (if you have MyChart) OR A paper copy in the mail If you have any lab test that is abnormal or we need to change your treatment, we will call you to review the results.   Testing/Procedures:  None Ordered   Follow-Up: At [facility], you and your health needs are our priority.  As part of our continuing mission to provide you with exceptional heart care, we have created designated Provider Care Teams.  These Care Teams include your primary Cardiologist (physician) and Advanced Practice Providers (APPs -  Physician Assistants and Nurse Practitioners) who all work together to provide you with the care you need, when you need it.  We recommend signing up for the patient portal called "MyChart".  Sign up information is provided on this After Visit Summary.  MyChart is used to connect with patients for Virtual Visits (Telemedicine).  Patients are able to view lab/test results, encounter notes, upcoming  appointments, etc.  Non-urgent messages can be sent to your provider as well.   To learn more about what you can do with MyChart, go to [email or web address].    Your next appointment:   12 month(s)  Provider:   You may see Kate Sable, MD or one of the following Advanced Practice Providers on your designated Care Team:   Murray Hodgkins, NP Christell Faith, PA-C Cadence Kathlen Mody, PA-C Gerrie Nordmann, NP    Signed, Kate Sable, MD  01/29/2023 8:52 AM    [place]

## 2023-02-02 ENCOUNTER — Ambulatory Visit: Payer: 59 | Admitting: *Deleted

## 2023-02-11 ENCOUNTER — Encounter: Payer: 59 | Attending: Internal Medicine | Admitting: *Deleted

## 2023-02-11 ENCOUNTER — Encounter: Payer: Self-pay | Admitting: *Deleted

## 2023-02-11 VITALS — BP 128/70 | Ht 66.0 in | Wt 179.7 lb

## 2023-02-11 DIAGNOSIS — E1165 Type 2 diabetes mellitus with hyperglycemia: Secondary | ICD-10-CM | POA: Diagnosis not present

## 2023-02-11 DIAGNOSIS — Z713 Dietary counseling and surveillance: Secondary | ICD-10-CM | POA: Diagnosis not present

## 2023-02-11 DIAGNOSIS — N184 Chronic kidney disease, stage 4 (severe): Secondary | ICD-10-CM | POA: Insufficient documentation

## 2023-02-11 DIAGNOSIS — Z794 Long term (current) use of insulin: Secondary | ICD-10-CM | POA: Diagnosis not present

## 2023-02-11 DIAGNOSIS — Z7984 Long term (current) use of oral hypoglycemic drugs: Secondary | ICD-10-CM | POA: Insufficient documentation

## 2023-02-11 DIAGNOSIS — Z6829 Body mass index (BMI) 29.0-29.9, adult: Secondary | ICD-10-CM | POA: Insufficient documentation

## 2023-02-11 DIAGNOSIS — E1122 Type 2 diabetes mellitus with diabetic chronic kidney disease: Secondary | ICD-10-CM | POA: Insufficient documentation

## 2023-02-11 NOTE — Patient Instructions (Signed)
Check blood sugars 2 x day before breakfast and 2 hrs after supper every day Bring blood sugar records to the next appointment  Exercise: Walk as tolerated - walk for 10 minutes after each meal  Eat 3 meals day,   1  snack a day Space meals 4-6 hours apart Avoid sugar sweetened drinks (juices) unless treating a low blood sugar  Carry glucose tablets and a snack at all times Rotate injection sites Hold insulin pen in place for 5-10 seconds after injection  Return for appointment on:  Wednesday April 07, 2023 at 11:15 am with [facility] (dietitian)

## 2023-02-11 NOTE — Progress Notes (Signed)
Diabetes Self-Management Education  Visit Type: First/Initial  Appt. Start Time: 1400 Appt. End Time: 1510  02/11/2023  [name], identified by name and date of birth, is a 72 y.o. female with a diagnosis of Diabetes: Type 2.   ASSESSMENT  Blood pressure 128/70, height 5' 6"$  (1.676 m), weight 179 lb 11.2 oz (81.5 kg). Body mass index is 29 kg/m.   Diabetes Self-Management Education - 02/11/23 1627       Visit Information   Visit Type First/Initial      Initial Visit   Diabetes Type Type 2    Date Diagnosed 10 years    Are you currently following a meal plan? Yes    What type of meal plan do you follow? "portion control"    Are you taking your medications as prescribed? Yes      Health Coping   How would you rate your overall health? Good      Psychosocial Assessment   Patient Belief/Attitude about Diabetes Other (comment)   "just sometimes"   What is the hardest part about your diabetes right now, causing you the most concern, or is the most worrisome to you about your diabetes?   Making healty food and beverage choices    Self-care barriers None    Self-management support Doctor's office    Patient Concerns Nutrition/Meal planning;Medication;Monitoring;Healthy Lifestyle;Problem Solving;Glycemic Control;Weight Control    Special Needs Simplified materials    Preferred Learning Style Auditory;Visual;Hands on    Learning Readiness Ready    How often do you need to have someone help you when you read instructions, pamphlets, or other written materials from your doctor or pharmacy? 2 - Rarely    What is the last grade level you completed in school? 12th      Pre-Education Assessment   Patient understands the diabetes disease and treatment process. Needs Review    Patient understands incorporating nutritional management into lifestyle. Needs Instruction    Patient undertands incorporating physical activity into lifestyle. Needs Instruction    Patient understands using  medications safely. Needs Instruction    Patient understands monitoring blood glucose, interpreting and using results Needs Review    Patient understands prevention, detection, and treatment of acute complications. Needs Instruction    Patient understands prevention, detection, and treatment of chronic complications. Needs Review    Patient understands how to develop strategies to address psychosocial issues. Needs Instruction    Patient understands how to develop strategies to promote health/change behavior. Needs Instruction      Complications   Last HgB A1C per patient/outside source 12.1 %   11/30/2022   How often do you check your blood sugar? 1-2 times/day    Fasting Blood glucose range (mg/dL) 180-200;>200   She reports FBG's 190-200's mg/dL.   Postprandial Blood glucose range (mg/dL) --   Pt reports bedtime readings 190-200's mg/dL.   Have you had a dilated eye exam in the past 12 months? Yes    Have you had a dental exam in the past 12 months? Yes    Are you checking your feet? Yes    How many days per week are you checking your feet? 3      Dietary Intake   Breakfast chicken, rice and gravy; sardines and grits; salmon or liver pudding with cabbage, potatoes; cereal    Lunch baked chicken, tuna, potatoes, peas, beans, green beans, broccoli, turnip greens, squash, okra, cuccumber, carrots, fruit (cranberries, apple, orange)    Dinner snack - peanut butter  crackers, popcorn, cuccumber or tossed salad    Beverage(s) water, juice, coffee, unsweetened tea      Activity / Exercise   Activity / Exercise Type ADL's      Patient Education   Previous Diabetes Education Yes (please comment)   [place] "along time ago"   Disease Pathophysiology Definition of diabetes, type 1 and 2, and the diagnosis of diabetes;Explored patient's options for treatment of their diabetes    Healthy Eating Role of diet in the treatment of diabetes and the relationship between the three main macronutrients and  blood glucose level;Food label reading, portion sizes and measuring food.;Plate Method;Reviewed blood glucose goals for pre and post meals and how to evaluate the patients' food intake on their blood glucose level.;Meal timing in regards to the patients' current diabetes medication.    Being Active Role of exercise on diabetes management, blood pressure control and cardiac health.    Medications Taught/reviewed insulin/injectables, injection, site rotation, insulin/injectables storage and needle disposal.;Reviewed patients medication for diabetes, action, purpose, timing of dose and side effects.    Monitoring Purpose and frequency of SMBG.;Taught/discussed recording of test results and interpretation of SMBG.;Identified appropriate SMBG and/or A1C goals.    Acute complications Taught prevention, symptoms, and  treatment of hypoglycemia - the 15 rule.    Chronic complications Relationship between chronic complications and blood glucose control    Diabetes Stress and Support Identified and addressed patients feelings and concerns about diabetes      Individualized Goals (developed by patient)   Reducing Risk Other (comment)   improve blood sugars, decrease medications, prevent diabetes complications, lose weight, lead a healthier lifestyle, become more fit     Outcomes   Expected Outcomes Demonstrated interest in learning but significant barriers to change    Future DMSE 2 months         Individualized Plan for Diabetes Self-Management Training:   Learning Objective:  Patient will have a greater understanding of diabetes self-management. Patient education plan is to attend individual and/or group sessions per assessed needs and concerns.   Plan:   Patient Instructions  Check blood sugars 2 x day before breakfast and 2 hrs after supper every day Bring blood sugar records to the next appointment  Exercise: Walk as tolerated - walk for 10 minutes after each meal  Eat 3 meals day,   1   snack a day Space meals 4-6 hours apart Avoid sugar sweetened drinks (juices) unless treating a low blood sugar  Carry glucose tablets and a snack at all times Rotate injection sites Hold insulin pen in place for 5-10 seconds after injection  Return for appointment on:  Wednesday April 07, 2023 at 11:15 am with [facility] (dietitian)  Expected Outcomes:  Demonstrated interest in learning but significant barriers to change  Education material provided:  General Meal Planning Guidelines Simple Meal Plan Glucose tablets Symptoms, causes and treatments of Hypoglycemia Diabetes Plate Method with pictures (ADA)  If problems or questions, patient to contact team via:   Johny Drilling, RN, [place], [place] [phone]  Future DSME appointment: 2 months April 07, 2023 with the dietitian

## 2023-03-22 ENCOUNTER — Inpatient Hospital Stay: Payer: 59 | Attending: Oncology | Admitting: Oncology

## 2023-03-22 ENCOUNTER — Encounter: Payer: Self-pay | Admitting: Oncology

## 2023-03-22 ENCOUNTER — Inpatient Hospital Stay: Payer: 59

## 2023-03-22 VITALS — BP 136/54 | HR 70 | Temp 96.3°F | Resp 18 | Ht 65.0 in | Wt 174.0 lb

## 2023-03-22 DIAGNOSIS — Z8673 Personal history of transient ischemic attack (TIA), and cerebral infarction without residual deficits: Secondary | ICD-10-CM | POA: Diagnosis not present

## 2023-03-22 DIAGNOSIS — Z87891 Personal history of nicotine dependence: Secondary | ICD-10-CM | POA: Insufficient documentation

## 2023-03-22 DIAGNOSIS — N189 Chronic kidney disease, unspecified: Secondary | ICD-10-CM

## 2023-03-22 DIAGNOSIS — Z9851 Tubal ligation status: Secondary | ICD-10-CM | POA: Diagnosis not present

## 2023-03-22 DIAGNOSIS — E1122 Type 2 diabetes mellitus with diabetic chronic kidney disease: Secondary | ICD-10-CM | POA: Diagnosis not present

## 2023-03-22 DIAGNOSIS — E538 Deficiency of other specified B group vitamins: Secondary | ICD-10-CM

## 2023-03-22 DIAGNOSIS — K219 Gastro-esophageal reflux disease without esophagitis: Secondary | ICD-10-CM | POA: Insufficient documentation

## 2023-03-22 DIAGNOSIS — R5383 Other fatigue: Secondary | ICD-10-CM | POA: Insufficient documentation

## 2023-03-22 DIAGNOSIS — Z79899 Other long term (current) drug therapy: Secondary | ICD-10-CM | POA: Insufficient documentation

## 2023-03-22 DIAGNOSIS — D472 Monoclonal gammopathy: Secondary | ICD-10-CM

## 2023-03-22 DIAGNOSIS — D631 Anemia in chronic kidney disease: Secondary | ICD-10-CM | POA: Insufficient documentation

## 2023-03-22 DIAGNOSIS — Z833 Family history of diabetes mellitus: Secondary | ICD-10-CM | POA: Insufficient documentation

## 2023-03-22 DIAGNOSIS — N183 Chronic kidney disease, stage 3 unspecified: Secondary | ICD-10-CM | POA: Diagnosis not present

## 2023-03-22 LAB — COMPREHENSIVE METABOLIC PANEL
ALT: 12 U/L (ref 0–44)
AST: 18 U/L (ref 15–41)
Albumin: 3.7 g/dL (ref 3.5–5.0)
Alkaline Phosphatase: 71 U/L (ref 38–126)
Anion gap: 5 (ref 5–15)
BUN: 53 mg/dL — ABNORMAL HIGH (ref 8–23)
CO2: 21 mmol/L — ABNORMAL LOW (ref 22–32)
Calcium: 8.7 mg/dL — ABNORMAL LOW (ref 8.9–10.3)
Chloride: 109 mmol/L (ref 98–111)
Creatinine, Ser: 2.12 mg/dL — ABNORMAL HIGH (ref 0.44–1.00)
GFR, Estimated: 25 mL/min — ABNORMAL LOW (ref >60)
Glucose, Bld: 151 mg/dL — ABNORMAL HIGH (ref 70–99)
Potassium: 4.8 mmol/L (ref 3.5–5.1)
Sodium: 135 mmol/L (ref 135–145)
Total Bilirubin: 0.6 mg/dL (ref 0.3–1.2)
Total Protein: 7.8 g/dL (ref 6.5–8.1)

## 2023-03-22 LAB — CBC WITH DIFFERENTIAL/PLATELET
Abs Immature Granulocytes: 0.02 10*3/uL (ref 0.00–0.07)
Basophils Absolute: 0 10*3/uL (ref 0.0–0.1)
Basophils Relative: 1 %
Eosinophils Absolute: 0.1 10*3/uL (ref 0.0–0.5)
Eosinophils Relative: 1 %
HCT: 29.4 % — ABNORMAL LOW (ref 36.0–46.0)
Hemoglobin: 9.7 g/dL — ABNORMAL LOW (ref 12.0–15.0)
Immature Granulocytes: 0 %
Lymphocytes Relative: 24 %
Lymphs Abs: 1.9 10*3/uL (ref 0.7–4.0)
MCH: 30.7 pg (ref 26.0–34.0)
MCHC: 33 g/dL (ref 30.0–36.0)
MCV: 93 fL (ref 80.0–100.0)
Monocytes Absolute: 0.5 10*3/uL (ref 0.1–1.0)
Monocytes Relative: 6 %
Neutro Abs: 5.1 10*3/uL (ref 1.7–7.7)
Neutrophils Relative %: 68 %
Platelets: 251 10*3/uL (ref 150–400)
RBC: 3.16 MIL/uL — ABNORMAL LOW (ref 3.87–5.11)
RDW: 12.8 % (ref 11.5–15.5)
WBC: 7.6 10*3/uL (ref 4.0–10.5)
nRBC: 0 % (ref 0.0–0.2)

## 2023-03-22 LAB — IRON AND TIBC
Iron: 63 ug/dL (ref 28–170)
Saturation Ratios: 26 % (ref 10.4–31.8)
TIBC: 246 ug/dL — ABNORMAL LOW (ref 250–450)
UIBC: 183 ug/dL

## 2023-03-22 LAB — FERRITIN: Ferritin: 276 ng/mL (ref 11–307)

## 2023-03-22 NOTE — Progress Notes (Signed)
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  She has now been referred to reestablish care.   Results of bloodwork from 03/09/2017 were as follows: CBC showed white count of 9.6, H&H of 10.3/29.9 and a platelet count of 301. CMP was significant for BUN of 39 and creatinine of 1.56. Reticulocyte count was 1.4% and low for the degree of anemia. Ferritin was normal at 51 and iron studies are within normal limits. B12 and folate were within normal limits. TSH was normal at 1.49. Haptoglobin was normal at 187. ESR was elevated at 70. Multiple myeloma panel showed elevated IgG of 1852. Serum monoclonal protein of 0.7 g was noted. Lambda light chain specificity. LDH was normal at 135 and beta-2 microglobulin was elevated at 4.2. 24-hour urine protein revealed 1gm proteinuria and 39 mg monoclonal protein in 24 hours.    Bone marrow biopsy on 04/20/17 showed normocellular marrow with trilineage hematopoiesis and mild increase in plasma cells 9% (5-10%)   Most recent labs from 12/16/2020 showed white count of 7.5, H&H of 10.6/31.9 with a platelet count of 288.  Serum creatinine was elevated at 1.5.  Serum calcium was normal at 9.4 and total protein normal at 7.1.  M spike was 0.9 on serum protein electrophoresis.  Both kappa and lambda free light chains were elevated with a normal free light chain ratio likely secondary to CKD     Interval history- Patient is doing well for her age.  She denies any recent hospitalizations.  She follows up regularly with nephrology Dr. Candiss Norse as well.  ECOG PS- 1 Pain scale- 0   Review of systems- Review of Systems  Constitutional:  Positive for malaise/fatigue. Negative for chills, fever and weight loss.  HENT:  Negative for congestion, ear discharge and nosebleeds.   Eyes:  Negative for blurred vision.  Respiratory:  Negative for cough, hemoptysis, sputum production, shortness of breath and wheezing.   Cardiovascular:  Negative for chest pain, palpitations, orthopnea and claudication.  Gastrointestinal:  Negative for abdominal pain, blood in stool, constipation, diarrhea, heartburn, melena, nausea and vomiting.  Genitourinary:  Negative for dysuria, flank pain, frequency, hematuria and urgency.  Musculoskeletal:  Negative for back pain, joint pain and myalgias.  Skin:  Negative for rash.  Neurological:  Negative for dizziness, tingling, focal weakness, seizures, weakness and headaches.  Endo/Heme/Allergies:  Does not bruise/bleed easily.  Psychiatric/Behavioral:  Negative for depression and suicidal ideas. The patient does not have insomnia.       Allergies  Allergen Reactions   Ziprasidone Hcl Other (See Comments)     Past Medical History:  Diagnosis Date   Anemia    Cataract    right eye getting it fixed 06/11/2021   Chronic kidney disease    Depression    Diabetes mellitus without complication (HCC)    GERD (gastroesophageal reflux disease)    Gout    Hypercholesteremia    Hypertension    Monoclonal gammopathy  Schizophrenia ([place])    pt denies this dx   Stroke [facility])      Past Surgical History:  Procedure Laterality Date   CATARACT EXTRACTION W/PHACO Left 03/11/2016   Procedure: CATARACT EXTRACTION PHACO AND INTRAOCULAR LENS PLACEMENT (IOC);  Surgeon: Leandrew Koyanagi, MD;  Location: [place];  Service: Ophthalmology;  Laterality: Left;   DIABETIC - insulin   COLONOSCOPY WITH PROPOFOL N/A 01/26/2022   Procedure: COLONOSCOPY WITH PROPOFOL;  Surgeon: [name], MD;  [facility] ENDOSCOPY;  Service: Endoscopy;  Laterality: N/A;   ESOPHAGOGASTRODUODENOSCOPY N/A 01/26/2022   Procedure: ESOPHAGOGASTRODUODENOSCOPY (EGD);  Surgeon: [name], MD;  Location[facility] ENDOSCOPY;  Service: Endoscopy;  Laterality: N/A;   ORIF TIBIA PLATEAU Right 09/05/06   [facility]   TUBAL LIGATION      Social History   Socioeconomic History   Marital status: Divorced    Spouse name: Not on file   Number of children: Not on file   Years of education: Not on file   Highest education level: Not on file  Occupational History   Not on file  Tobacco Use   Smoking status: Former    Packs/day: 0.50    Years: 5.00    Additional pack years: 0.00    Total pack years: 2.50    Types: Cigarettes    Quit date: 2021    Years since quitting: 3.2   Smokeless tobacco: Former    Types: Snuff   Tobacco comments:    Quit 2 years ago. At her heaviest she smoked 2 packs a week.  Vaping Use   Vaping Use: Never used  Substance and Sexual Activity   Alcohol use: Not Currently    Alcohol/week: 1.0 standard drink of alcohol    Types: 1 Cans of beer per week    Comment: rarely   Drug use: No   Sexual activity: Never  Other Topics Concern   Not on file  Social History Narrative   Not on file   Social Determinants of Health   Financial Resource Strain: Not on file  Food Insecurity: Not on file  Transportation Needs: Not on file  Physical Activity: Not on file  Stress: Not on file  Social Connections: Not on file  Intimate Partner Violence: Not on file    Family History  Problem Relation Age of Onset   Diabetes Sister    Breast cancer Neg Hx      Current Outpatient Medications:    ACCU-CHEK AVIVA PLUS test strip, 1 each daily., Disp: , Rfl:    aluminum-magnesium hydroxide-simethicone (MAALOX) [id number] MG/5ML SUSP, Take 30 mLs by mouth 4 (four)  times daily -  before meals and at bedtime., Disp: 355 mL, Rfl: 0   aspirin 81 MG chewable tablet, Chew 81 mg by mouth daily., Disp: , Rfl:    atorvastatin (LIPITOR) 80 MG tablet, Take 1 tablet (80 mg total) by mouth daily., Disp: 90 tablet, Rfl: 3   Fe Fum-Fe Poly-Vit C-Lactobac (FUSION PO), Take 1 capsule by mouth 3 (three) times a week., Disp: , Rfl:    JARDIANCE 10 MG TABS tablet, Take 10 mg by mouth daily., Disp: , Rfl:    LANTUS SOLOSTAR 100 UNIT/ML Solostar Pen, Inject 10 Units into the skin at bedtime., Disp: , Rfl:    lisinopril (ZESTRIL) 2.5 MG tablet, Take 2.5 mg by mouth daily., Disp: , Rfl:    Misc. Devices (WALKER) MISC, 1 each by Does not apply route daily as needed., Disp: 1 each, Rfl: 0  omeprazole (PRILOSEC) 40 MG capsule, Take 1 capsule by mouth daily., Disp: , Rfl:    Vitamin D, Cholecalciferol, 25 MCG (1000 UT) CAPS, Take 1 capsule by mouth daily., Disp: , Rfl:   Physical exam:  Vitals:   03/22/23 1318 03/22/23 1321 03/22/23 1333  BP: (!) 155/61 (!) 142/66 (!) 136/54  Pulse: 73 71 70  Resp: 18    Temp: (!) 96.3 F (35.7 C)    TempSrc: Tympanic    SpO2: 100%    Weight: 174 lb (78.9 kg)    Height: 5\' 5"  (1.651 m)     Physical Exam Cardiovascular:     Rate and Rhythm: Normal rate and regular rhythm.     Heart sounds: Normal heart sounds.  Pulmonary:     Effort: Pulmonary effort is normal.     Breath sounds: Normal breath sounds.  Skin:    General: Skin is warm and dry.  Neurological:     Mental Status: She is alert and oriented to person, place, and time.         Latest Ref Rng & Units 03/22/2023   12:36 PM  CMP  Glucose 70 - 99 mg/dL 151   BUN 8 - 23 mg/dL 53   Creatinine 0.44 - 1.00 mg/dL 2.12   Sodium 135 - 145 mmol/L 135   Potassium 3.5 - 5.1 mmol/L 4.8   Chloride 98 - 111 mmol/L 109   CO2 22 - 32 mmol/L 21   Calcium 8.9 - 10.3 mg/dL 8.7   Total Protein 6.5 - 8.1 g/dL 7.8   Total Bilirubin 0.3 - 1.2 mg/dL 0.6   Alkaline Phos 38 - 126 U/L 71    AST 15 - 41 U/L 18   ALT 0 - 44 U/L 12       Latest Ref Rng & Units 03/22/2023   12:36 PM  CBC  WBC 4.0 - 10.5 K/uL 7.6   Hemoglobin 12.0 - 15.0 g/dL 9.7   Hematocrit 36.0 - 46.0 % 29.4   Platelets 150 - 400 K/uL 251      Assessment and plan- Patient is a 72 y.o. female here for routine follow-up of anemia of chronic kidney disease  Patient last received 2 doses of Feraheme in January 2024.  Her ferritin levels are 276 today and she does not require any IV iron.  Her hemoglobin is presently stable between 9.5-10 I am therefore not initiating EPO injections at this time.  I will continue to monitor her CBC ferritin and iron studies in 3 and 6 months and see her back in 6 months.  Should her hemoglobin drop down to less than 9 I will initiate and discussed Depo at that time   Visit Diagnosis 1. B12 deficiency   2. Anemia of chronic kidney failure, stage 3 (moderate) (HCC)      Dr. Randa Evens, MD, MPH [facility] [id number] 03/22/2023 3:15 PM              ]

## 2023-03-23 LAB — KAPPA/LAMBDA LIGHT CHAINS
Kappa free light chain: 88.6 mg/L — ABNORMAL HIGH (ref 3.3–19.4)
Kappa, lambda light chain ratio: 0.97 (ref 0.26–1.65)
Lambda free light chains: 91.8 mg/L — ABNORMAL HIGH (ref 5.7–26.3)

## 2023-03-26 LAB — MULTIPLE MYELOMA PANEL, SERUM
Albumin SerPl Elph-Mcnc: 3.8 g/dL (ref 2.9–4.4)
Albumin/Glob SerPl: 1.1 (ref 0.7–1.7)
Alpha 1: 0.2 g/dL (ref 0.0–0.4)
Alpha2 Glob SerPl Elph-Mcnc: 0.7 g/dL (ref 0.4–1.0)
B-Globulin SerPl Elph-Mcnc: 0.9 g/dL (ref 0.7–1.3)
Gamma Glob SerPl Elph-Mcnc: 1.8 g/dL (ref 0.4–1.8)
Globulin, Total: 3.5 g/dL (ref 2.2–3.9)
IgA: 174 mg/dL (ref 87–352)
IgG (Immunoglobin G), Serum: 1938 mg/dL — ABNORMAL HIGH (ref 586–1602)
IgM (Immunoglobulin M), Srm: 105 mg/dL (ref 26–217)
M Protein SerPl Elph-Mcnc: 1 g/dL — ABNORMAL HIGH
Total Protein ELP: 7.3 g/dL (ref 6.0–8.5)

## 2023-04-03 ENCOUNTER — Emergency Department: Payer: 59

## 2023-04-03 ENCOUNTER — Emergency Department
Admission: EM | Admit: 2023-04-03 | Discharge: 2023-04-03 | Disposition: A | Discharge status: Home / Self Care | Payer: 59 | Attending: Emergency Medicine | Admitting: Emergency Medicine

## 2023-04-03 ENCOUNTER — Other Ambulatory Visit: Payer: Self-pay

## 2023-04-03 DIAGNOSIS — M79601 Pain in right arm: Secondary | ICD-10-CM | POA: Diagnosis not present

## 2023-04-03 DIAGNOSIS — Z79899 Other long term (current) drug therapy: Secondary | ICD-10-CM | POA: Diagnosis not present

## 2023-04-03 DIAGNOSIS — G8191 Hemiplegia, unspecified affecting right dominant side: Secondary | ICD-10-CM | POA: Diagnosis not present

## 2023-04-03 DIAGNOSIS — Z87891 Personal history of nicotine dependence: Secondary | ICD-10-CM | POA: Diagnosis not present

## 2023-04-03 DIAGNOSIS — N189 Chronic kidney disease, unspecified: Secondary | ICD-10-CM | POA: Insufficient documentation

## 2023-04-03 DIAGNOSIS — M79604 Pain in right leg: Secondary | ICD-10-CM

## 2023-04-03 DIAGNOSIS — E1122 Type 2 diabetes mellitus with diabetic chronic kidney disease: Secondary | ICD-10-CM | POA: Insufficient documentation

## 2023-04-03 DIAGNOSIS — I129 Hypertensive chronic kidney disease with stage 1 through stage 4 chronic kidney disease, or unspecified chronic kidney disease: Secondary | ICD-10-CM | POA: Diagnosis not present

## 2023-04-03 DIAGNOSIS — Z7982 Long term (current) use of aspirin: Secondary | ICD-10-CM | POA: Insufficient documentation

## 2023-04-03 NOTE — ED Triage Notes (Signed)
Pt presents via EMS c/o right side pain "from right shoulder to foot". Denies injury per EMS.

## 2023-04-03 NOTE — ED Triage Notes (Addendum)
Pt c/o right arm shoulder leg pain since Monday. Pt denies injury, numbness, weakness. Upper and lower extremities have equal strength bilaterally. Pt denies CP, SHOB, dizziness, visual changes. Pt states she her PCP told her to take tylenol but it has not improved s/s.  Pt is AOX4, NAD noted.

## 2023-04-03 NOTE — ED Provider Notes (Signed)
[facility] Emergency Department Provider Note   ____________________________________________   Event Date/Time   First MD Initiated Contact with Patient 04/03/23 1704     (approximate)  I have reviewed the triage vital signs and the nursing notes.   HISTORY  Chief Complaint Leg Pain    HPI Jennifer Wood is a 72 y.o. female reports to the emergency room for complaint of entire right side pain.  Patient reports that she was seen by her primary care provider and told to take Tylenol.  She reports this did not resolve the problem.  Patient's main concern is that she has severe pain in her right shoulder and at that she has severe pain in her right lower leg.  She reports that her right leg feels "fuzzy".  Patient reports that the pain started on Monday without any reasoning.  She denies any falls.  Patient is normally at home ambulatory without difficulty.  She reports that she has been having to walk more slowly as a result of the pain.  However, she is still able to stand/bear weight equally.  Patient has equal handgrips.  Patient has equal pulses.  Patient has equal sensation.  No neurological deficit noted on exam.   Past Medical History:  Diagnosis Date   Anemia    Cataract    right eye getting it fixed 06/11/2021   Chronic kidney disease    Depression    Diabetes mellitus without complication    GERD (gastroesophageal reflux disease)    Gout    Hypercholesteremia    Hypertension    Monoclonal gammopathy    Schizophrenia    pt denies this dx   Stroke     Patient Active Problem List   Diagnosis Date Noted   Polyp of ascending colon    Chronic GERD    B12 deficiency 02/07/2021   History of stroke 08/05/2020   MGUS (monoclonal gammopathy of unknown significance) 11/05/2017   Schizophrenia 03/09/2017   Hypertension 03/09/2017   Diabetes 03/09/2017   Depression 03/09/2017    Past Surgical History:  Procedure Laterality Date   CATARACT  EXTRACTION W/PHACO Left 03/11/2016   Procedure: CATARACT EXTRACTION PHACO AND INTRAOCULAR LENS PLACEMENT (IOC);  Surgeon: Lockie Molahadwick Brasington, MD;  [facility];  Service: Ophthalmology;  Laterality: Left;  DIABETIC - insulin   COLONOSCOPY WITH PROPOFOL N/A 01/26/2022   Procedure: COLONOSCOPY WITH PROPOFOL;  Surgeon: Midge MiniumWohl, Darren, MD;  [facility];  Service: Endoscopy;  Laterality: N/A;   ESOPHAGOGASTRODUODENOSCOPY N/A 01/26/2022   Procedure: ESOPHAGOGASTRODUODENOSCOPY (EGD);  Surgeon: Midge MiniumWohl, Darren, MD;  [facility];  Service: Endoscopy;  Laterality: N/A;   ORIF TIBIA PLATEAU Right 09/05/06   [facility]   TUBAL LIGATION      Prior to Admission medications   Medication Sig Start Date End Date Taking? Authorizing Provider  ACCU-CHEK AVIVA PLUS test strip 1 each daily. 10/27/21   [provider]  aluminum-magnesium hydroxide-simethicone (MAALOX) 200-200-20 MG/5ML SUSP Take 30 mLs by mouth 4 (four) times daily -  before meals and at bedtime. 02/03/22   Sharman CheekStafford, Phillip, MD  aspirin 81 MG chewable tablet Chew 81 mg by mouth daily. 08/05/20   [provider]  atorvastatin (LIPITOR) 80 MG tablet Take 1 tablet (80 mg total) by mouth daily. 01/29/23 01/24/24  Debbe OdeaAgbor-Etang, Brian, MD  Fe Fum-Fe Poly-Vit C-Lactobac (FUSION PO) Take 1 capsule by mouth 3 (three) times a week.    [provider]  JARDIANCE 10 MG TABS tablet Take  10 mg by mouth daily. 10/23/21   [provider]  LANTUS SOLOSTAR 100 UNIT/ML Solostar Pen Inject 10 Units into the skin at bedtime. 12/04/22   [provider]  lisinopril (ZESTRIL) 2.5 MG tablet Take 2.5 mg by mouth daily. 10/23/21   [provider]  Misc. Devices (WALKER) MISC 1 each by Does not apply route daily as needed. 09/13/21   Dionne Bucy, MD  omeprazole (PRILOSEC) 40 MG capsule Take 1 capsule by mouth daily. 05/20/22   [provider]  Vitamin D, Cholecalciferol, 25 MCG (1000 UT)  CAPS Take 1 capsule by mouth daily.    [provider]    Allergies Ziprasidone hcl  Family History  Problem Relation Age of Onset   Diabetes Sister    Breast cancer Neg Hx     Social History Social History   Tobacco Use   Smoking status: Former    Packs/day: 0.50    Years: 5.00    Additional pack years: 0.00    Total pack years: 2.50    Types: Cigarettes    Quit date: 2021    Years since quitting: 3.2   Smokeless tobacco: Former    Types: Snuff   Tobacco comments:    Quit 2 years ago. At her heaviest she smoked 2 packs a week.  Vaping Use   Vaping Use: Never used  Substance Use Topics   Alcohol use: Not Currently    Alcohol/week: 1.0 standard drink of alcohol    Types: 1 Cans of beer per week    Comment: rarely   Drug use: No    Review of Systems  Constitutional: No fever/chills Eyes: No visual changes. ENT: No sore throat. Cardiovascular: Denies chest pain. Respiratory: Denies shortness of breath. Gastrointestinal: No abdominal pain.  No nausea, no vomiting.  No diarrhea.  No constipation. Genitourinary: Negative for dysuria. Musculoskeletal: Complains of pain to the entire right side of her body. Skin: Negative for rash. Neurological: Negative for headaches, focal weakness or numbness.   ____________________________________________   PHYSICAL EXAM:  VITAL SIGNS: ED Triage Vitals  Enc Vitals Group     BP 04/03/23 1625 (!) 151/66     Pulse Rate 04/03/23 1625 78     Resp 04/03/23 1625 16     Temp 04/03/23 1625 98.8 F (37.1 C)     Temp Source 04/03/23 1625 Oral     SpO2 04/03/23 1625 95 %     Weight --      Height --      Head Circumference --      Peak Flow --      Pain Score 04/03/23 1626 10     Pain Loc --      Pain Edu? --      Excl. in GC? --     Constitutional: Alert and oriented. Well appearing and in no acute distress. Eyes: Conjunctivae are normal. PERRL. EOMI. Head: Atraumatic. Mouth/Throat: Mucous membranes are  moist. Neck: No stridor.   Cardiovascular: Normal rate, regular rhythm. Grossly normal heart sounds.  Good peripheral circulation.  Peripheral pulses equal. Respiratory: Normal respiratory effort.  No retractions. Lungs CTAB. Gastrointestinal: Soft and nontender. No distention. No abdominal bruits. No CVA tenderness.  No rebound or guarding noted. Musculoskeletal: No swelling noted to bilateral lower extremities.  Pedal pulses 2+ and strong.  Neurologic:  Normal speech and language. No gross focal neurologic deficits are appreciated. No gait instability.  Sensation intact. Skin:  Skin is warm, dry and intact. No  rash noted. Psychiatric: Mood and affect are normal. Speech and behavior are normal.  ____________________________________________   LABS (all labs ordered are listed, but only abnormal results are displayed)  Labs Reviewed - No data to display ____________________________________________  EKG   ____________________________________________  RADIOLOGY  ED MD interpretation: X-rays and CT reviewed by me and read by radiologist.  Official radiology report(s): CT Head Wo Contrast  Result Date: 04/03/2023 CLINICAL DATA:  Right-sided weakness for several days, initial encounter EXAM: CT HEAD WITHOUT CONTRAST TECHNIQUE: Contiguous axial images were obtained from the base of the skull through the vertex without intravenous contrast. RADIATION DOSE REDUCTION: This exam was performed according to the departmental dose-optimization program which includes automated exposure control, adjustment of the mA and/or kV according to patient size and/or use of iterative reconstruction technique. COMPARISON:  09/15/2022 FINDINGS: Brain: No evidence of acute infarction, hemorrhage, hydrocephalus, extra-axial collection or mass lesion/mass effect. Vascular: No hyperdense vessel or unexpected calcification. Skull: Normal. Negative for fracture or focal lesion. Sinuses/Orbits: No acute finding. Other:  None. IMPRESSION: No acute intracranial abnormality noted. Electronically Signed   By: Alcide Clever M.D.   On: 04/03/2023 19:17   DG Shoulder Right  Result Date: 04/03/2023 CLINICAL DATA:  Pain EXAM: RIGHT SHOULDER - 2+ VIEW COMPARISON:  Right humerus done on 08/07/2011 FINDINGS: No recent fracture or dislocation is seen. There are no focal lytic lesions. No abnormal soft tissue calcifications are seen. Bony spurs are noted along the inferior margin of acromion. IMPRESSION: No recent fracture or dislocation is seen in right shoulder. No abnormal soft tissue calcifications are seen. Electronically Signed   By: Ernie Avena M.D.   On: 04/03/2023 18:02    ____________________________________________   PROCEDURES  Procedure(s) performed: None  Procedures  Critical Care performed: No  ____________________________________________   INITIAL IMPRESSION / ASSESSMENT AND PLAN / ED COURSE     MARIXSA COLLEN is a 72 y.o. female reports to the emergency room for complaint of entire right side pain.  Patient reports that she was seen by her primary care provider and told to take Tylenol.  She reports this did not resolve the problem.  Patient's main concern is that she has severe pain in her right shoulder and at that she has severe pain in her right lower leg.  She reports that her right leg feels "fuzzy".  Patient reports that the pain started on Monday without any reasoning.  She denies any falls.  Patient is normally at home ambulatory without difficulty.  She reports that she has been having to walk more slowly as a result of the pain.  However, she is still able to stand/bear weight equally.  Patient has equal handgrips.  Patient has equal pulses.  Patient has equal sensation.  No neurological deficit noted on exam.   Will obtain x-ray of right shoulder as well as CT of head.  CT of head shows no acute intracranial processes.  X-ray of right shoulder shows no acute fractures or  dislocations.  I discussed the findings of the radiology results with patient.  She is relieved to know that there were no abnormalities found.  She states that her pain is improving some.  I have offered her reassurance.  She should take Tylenol or ibuprofen for the discomfort.  I have also encouraged her to follow-up with her primary care provider next week if symptoms persist or worsen.  Based on patient's overall well appearance and reassuring exam, I will discharge her home in stable condition  at this time.      ____________________________________________   FINAL CLINICAL IMPRESSION(S) / ED DIAGNOSES  Final diagnoses:  Right arm pain  Right leg pain     ED Discharge Orders     None        Note:  This document was prepared using Dragon voice recognition software and may include unintentional dictation errors.     Herschell Dimes, NP 04/03/23 Graylon Gunning    Phineas Semen, MD 04/03/23 [phone]

## 2023-04-03 NOTE — Discharge Instructions (Signed)
You have been seen today in the emergency room for complaint of right arm and right leg pain.  Your radiology exams were normal.  Your exam was very reassuring.  I encourage you to take Tylenol or ibuprofen for your pain/discomfort and follow-up with your primary care provider next week.  If symptoms persist or worsen you can always return to urgent care or the emergency room.

## 2023-04-07 ENCOUNTER — Ambulatory Visit: Payer: 59 | Admitting: Dietician

## 2023-04-21 IMAGING — MG MM DIGITAL SCREENING BILAT W/ TOMO AND CAD
6 of 10 series · 6 of 30 positions shown · non-contrast
Comparison: Previous exam(s).

CLINICAL DATA: Screening.

EXAM:
DIGITAL SCREENING BILATERAL MAMMOGRAM WITH TOMOSYNTHESIS AND CAD
TECHNIQUE: Bilateral screening digital craniocaudal and mediolateral oblique
mammograms were obtained. Bilateral screening digital breast
tomosynthesis was performed. The images were evaluated with
computer-aided detection.

[R CC synth-2D]
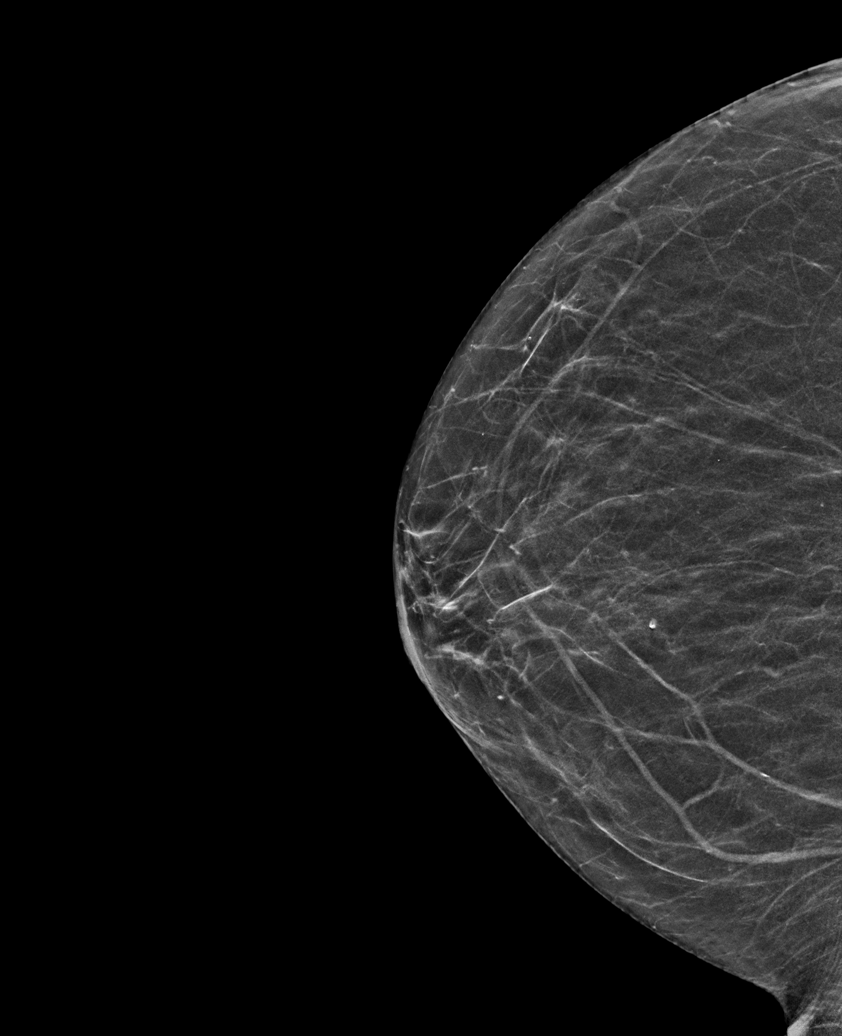

[L MLO synth-2D]
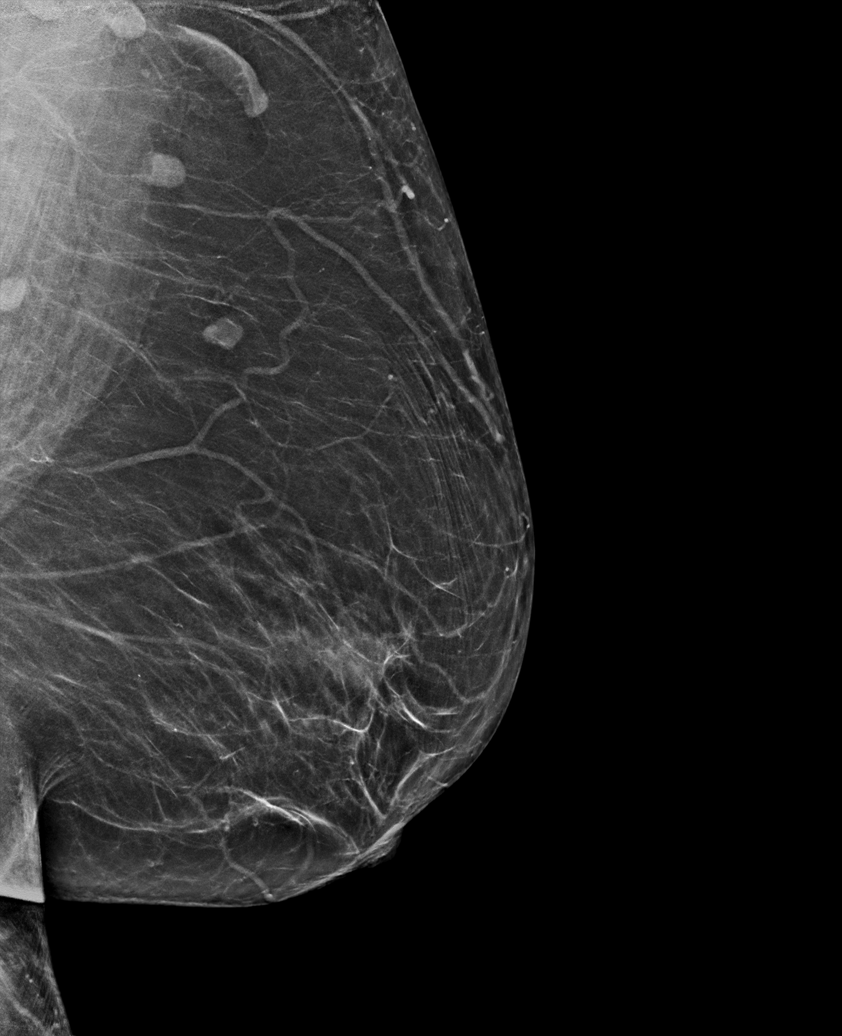

[R MLO synth-2D]
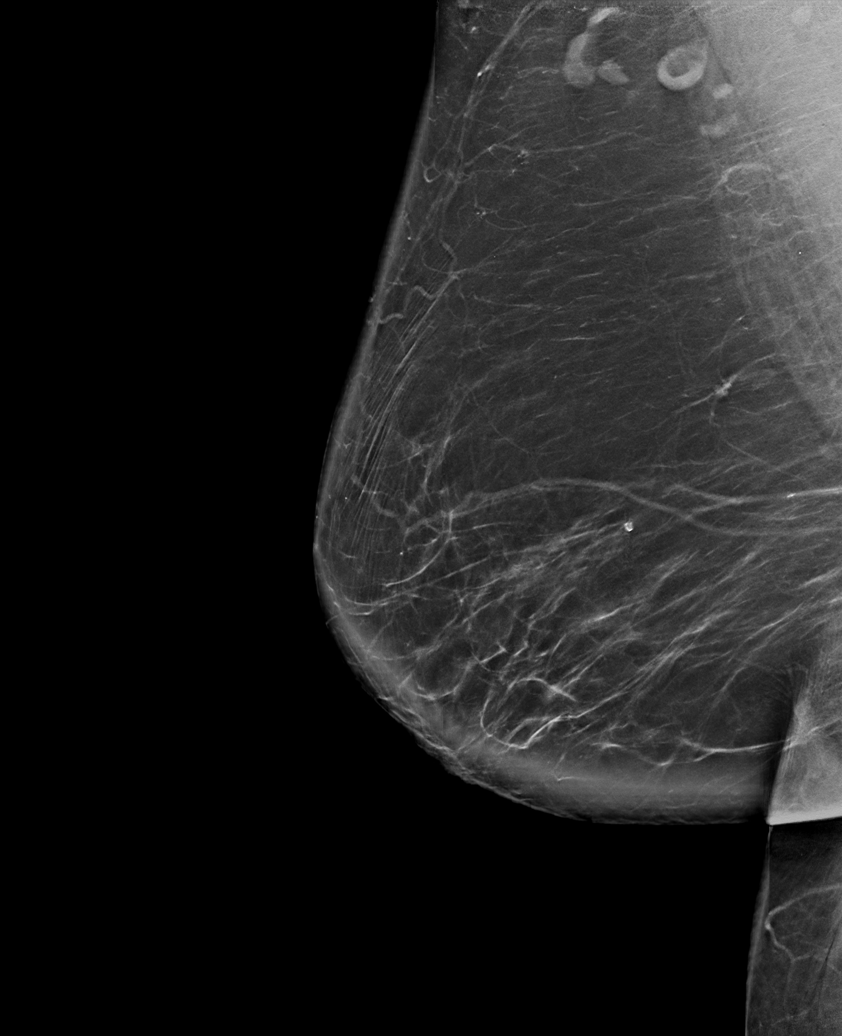

[R CV synth-2D]
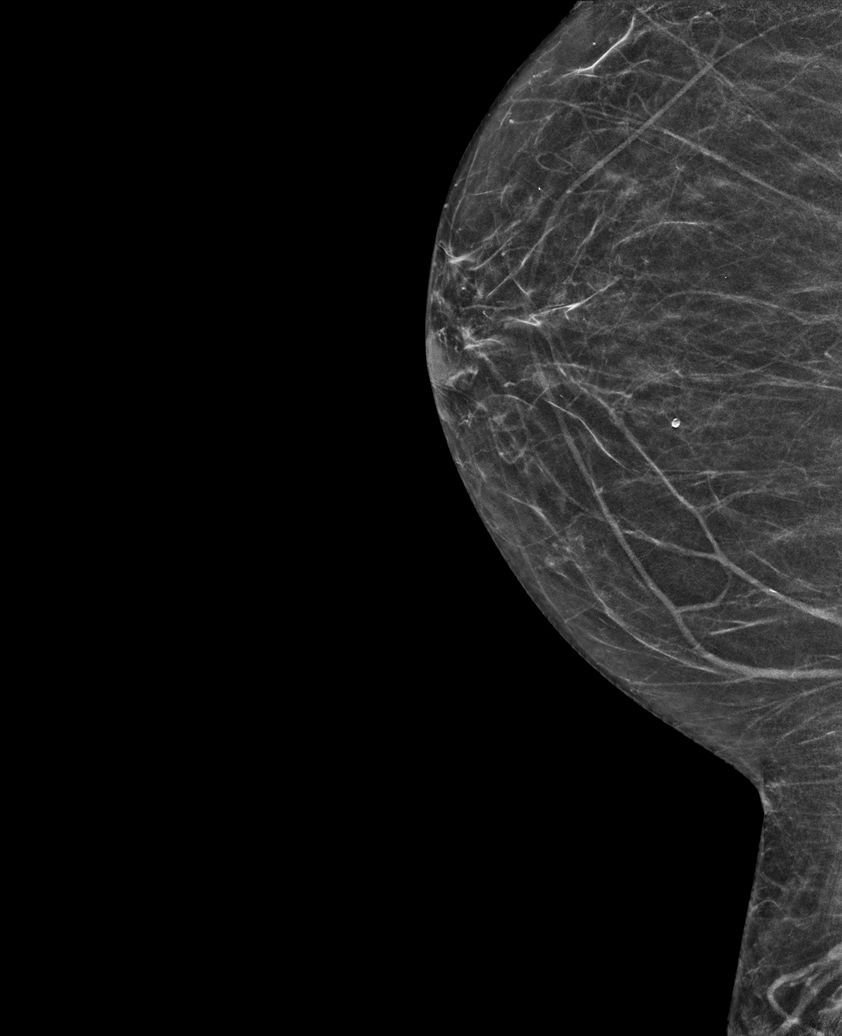

[L CC synth-2D]
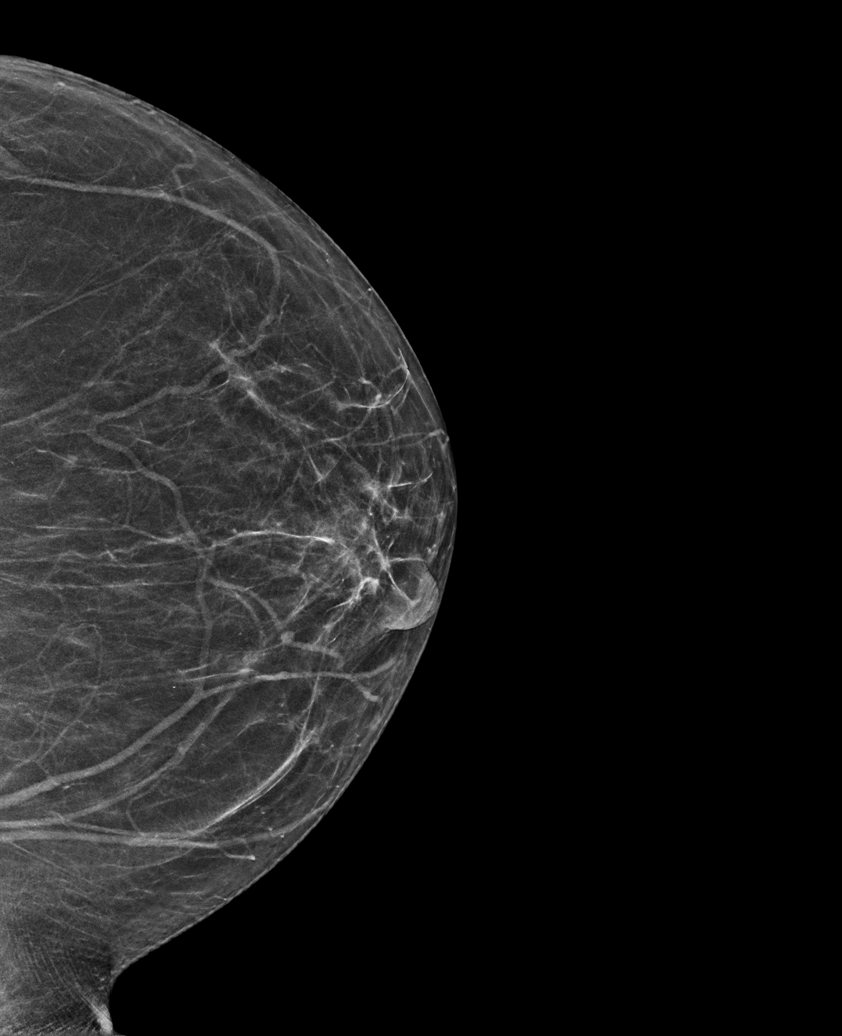

[R MLO tomo · tomo slice 52/103.0]
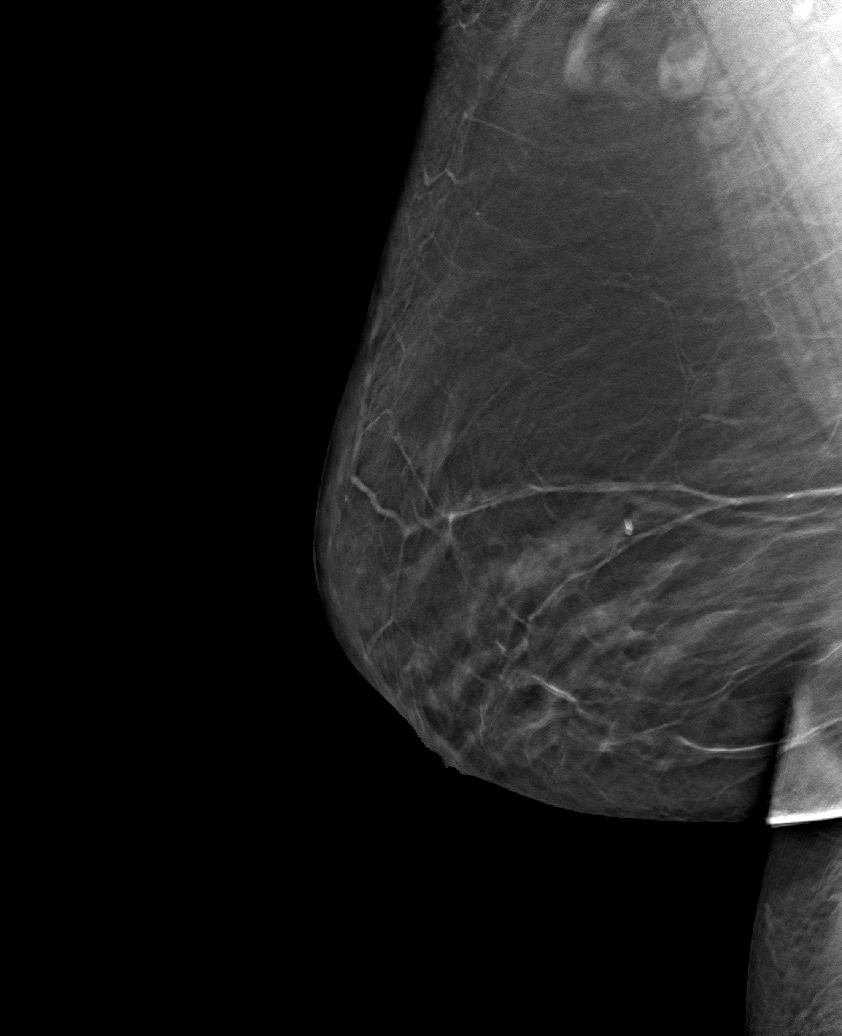

[6 of 30 positions shown; findings below may reference images not displayed]

ACR Breast Density Category b: There are scattered areas of
fibroglandular density.
FINDINGS: There are no findings suspicious for malignancy.
IMPRESSION: No mammographic evidence of malignancy. A result letter of this
screening mammogram will be mailed directly to the patient.

RECOMMENDATION:
Screening mammogram in one year. (Code:51-O-LD2)

BI-RADS CATEGORY  1: Negative.

## 2023-05-12 ENCOUNTER — Encounter: Payer: Self-pay | Admitting: Dietician

## 2023-05-12 NOTE — Progress Notes (Signed)
Have not heard back from patient to reschedule her missed appointment from 04/07/23. Sent notification to referring provider.

## 2023-06-22 ENCOUNTER — Inpatient Hospital Stay: Payer: 59 | Attending: Oncology

## 2023-06-22 DIAGNOSIS — E538 Deficiency of other specified B group vitamins: Secondary | ICD-10-CM | POA: Insufficient documentation

## 2023-06-22 DIAGNOSIS — D631 Anemia in chronic kidney disease: Secondary | ICD-10-CM | POA: Diagnosis present

## 2023-06-22 DIAGNOSIS — N183 Chronic kidney disease, stage 3 unspecified: Secondary | ICD-10-CM | POA: Diagnosis not present

## 2023-06-22 LAB — CBC
HCT: 29.5 % — ABNORMAL LOW (ref 36.0–46.0)
Hemoglobin: 9.8 g/dL — ABNORMAL LOW (ref 12.0–15.0)
MCH: 30.6 pg (ref 26.0–34.0)
MCHC: 33.2 g/dL (ref 30.0–36.0)
MCV: 92.2 fL (ref 80.0–100.0)
Platelets: 250 10*3/uL (ref 150–400)
RBC: 3.2 MIL/uL — ABNORMAL LOW (ref 3.87–5.11)
RDW: 11.9 % (ref 11.5–15.5)
WBC: 6.2 10*3/uL (ref 4.0–10.5)
nRBC: 0 % (ref 0.0–0.2)

## 2023-06-22 LAB — IRON AND TIBC
Iron: 74 ug/dL (ref 28–170)
Saturation Ratios: 35 % — ABNORMAL HIGH (ref 10.4–31.8)
TIBC: 213 ug/dL — ABNORMAL LOW (ref 250–450)
UIBC: 139 ug/dL

## 2023-06-22 LAB — FERRITIN: Ferritin: 274 ng/mL (ref 11–307)

## 2023-09-22 ENCOUNTER — Inpatient Hospital Stay (HOSPITAL_BASED_OUTPATIENT_CLINIC_OR_DEPARTMENT_OTHER): Payer: 59 | Admitting: Oncology

## 2023-09-22 ENCOUNTER — Encounter: Payer: Self-pay | Admitting: Oncology

## 2023-09-22 ENCOUNTER — Inpatient Hospital Stay: Payer: 59 | Attending: Oncology

## 2023-09-22 VITALS — BP 148/76 | HR 70 | Temp 96.4°F | Resp 18 | Ht 65.0 in | Wt 174.1 lb

## 2023-09-22 DIAGNOSIS — D472 Monoclonal gammopathy: Secondary | ICD-10-CM | POA: Diagnosis not present

## 2023-09-22 DIAGNOSIS — E1122 Type 2 diabetes mellitus with diabetic chronic kidney disease: Secondary | ICD-10-CM | POA: Diagnosis not present

## 2023-09-22 DIAGNOSIS — K219 Gastro-esophageal reflux disease without esophagitis: Secondary | ICD-10-CM | POA: Diagnosis not present

## 2023-09-22 DIAGNOSIS — Z833 Family history of diabetes mellitus: Secondary | ICD-10-CM | POA: Diagnosis not present

## 2023-09-22 DIAGNOSIS — D631 Anemia in chronic kidney disease: Secondary | ICD-10-CM | POA: Insufficient documentation

## 2023-09-22 DIAGNOSIS — N183 Chronic kidney disease, stage 3 unspecified: Secondary | ICD-10-CM | POA: Diagnosis not present

## 2023-09-22 DIAGNOSIS — Z87891 Personal history of nicotine dependence: Secondary | ICD-10-CM | POA: Diagnosis not present

## 2023-09-22 DIAGNOSIS — Z8673 Personal history of transient ischemic attack (TIA), and cerebral infarction without residual deficits: Secondary | ICD-10-CM | POA: Insufficient documentation

## 2023-09-22 DIAGNOSIS — E538 Deficiency of other specified B group vitamins: Secondary | ICD-10-CM

## 2023-09-22 DIAGNOSIS — Z79899 Other long term (current) drug therapy: Secondary | ICD-10-CM | POA: Insufficient documentation

## 2023-09-22 LAB — BASIC METABOLIC PANEL
Anion gap: 5 (ref 5–15)
BUN: 59 mg/dL — ABNORMAL HIGH (ref 8–23)
CO2: 22 mmol/L (ref 22–32)
Calcium: 8.9 mg/dL (ref 8.9–10.3)
Chloride: 109 mmol/L (ref 98–111)
Creatinine, Ser: 2.45 mg/dL — ABNORMAL HIGH (ref 0.44–1.00)
GFR, Estimated: 21 mL/min — ABNORMAL LOW (ref >60)
Glucose, Bld: 165 mg/dL — ABNORMAL HIGH (ref 70–99)
Potassium: 4.7 mmol/L (ref 3.5–5.1)
Sodium: 136 mmol/L (ref 135–145)

## 2023-09-22 LAB — CBC
HCT: 30 % — ABNORMAL LOW (ref 36.0–46.0)
Hemoglobin: 9.9 g/dL — ABNORMAL LOW (ref 12.0–15.0)
MCH: 30.9 pg (ref 26.0–34.0)
MCHC: 33 g/dL (ref 30.0–36.0)
MCV: 93.8 fL (ref 80.0–100.0)
Platelets: 259 10*3/uL (ref 150–400)
RBC: 3.2 MIL/uL — ABNORMAL LOW (ref 3.87–5.11)
RDW: 12.1 % (ref 11.5–15.5)
WBC: 7.4 10*3/uL (ref 4.0–10.5)
nRBC: 0 % (ref 0.0–0.2)

## 2023-09-22 LAB — FERRITIN: Ferritin: 192 ng/mL (ref 11–307)

## 2023-09-22 LAB — IRON AND TIBC
Iron: 75 ug/dL (ref 28–170)
Saturation Ratios: 31 % (ref 10.4–31.8)
TIBC: 242 ug/dL — ABNORMAL LOW (ref 250–450)
UIBC: 167 ug/dL

## 2023-09-22 LAB — VITAMIN B12: Vitamin B-12: 286 pg/mL (ref 180–914)

## 2023-09-22 LAB — FOLATE: Folate: 10.9 ng/mL (ref >5.9)

## 2023-09-22 NOTE — Progress Notes (Signed)
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  She has now been referred to reestablish care.   Results of bloodwork from 03/09/2017 were as follows: CBC showed white count of 9.6, H&H of 10.3/29.9 and a platelet count of 301. CMP was significant for BUN of 39 and creatinine of 1.56. Reticulocyte count was 1.4% and low for the degree of anemia. Ferritin was normal at 51 and iron studies are within normal limits. B12 and folate were within normal limits. TSH was normal at 1.49. Haptoglobin was normal at 187. ESR was elevated at 70. Multiple myeloma panel showed elevated IgG of 1852. Serum monoclonal protein of 0.7 g was noted. Lambda light chain specificity. LDH was normal at 135 and beta-2 microglobulin was elevated at 4.2. 24-hour urine protein revealed 1gm proteinuria and 39 mg monoclonal protein in 24 hours.    Bone marrow biopsy on 04/20/17 showed normocellular marrow with trilineage hematopoiesis and mild increase in plasma cells 9% (5-10%)   Most recent labs from 12/16/2020 showed white count of 7.5, H&H of 10.6/31.9 with a platelet count of 288.  Serum creatinine was elevated at 1.5.  Serum calcium was normal at 9.4 and total protein normal at 7.1.  M spike was 0.9 on serum protein electrophoresis.  Both kappa and lambda free light chains were elevated with a normal free light chain ratio likely secondary to  CKD  Interval history-she is at her baseline state of health.  She has fatigue and chronic back pain  ECOG PS- 2 Pain scale- 0   Review of systems- Review of Systems  Constitutional:  Positive for malaise/fatigue. Negative for chills, fever and weight loss.  HENT:  Negative for congestion, ear discharge and nosebleeds.   Eyes:  Negative for blurred vision.  Respiratory:  Negative for cough, hemoptysis, sputum production, shortness of breath and wheezing.   Cardiovascular:  Negative for chest pain, palpitations, orthopnea and claudication.  Gastrointestinal:  Negative for abdominal pain, blood in stool, constipation, diarrhea, heartburn, melena, nausea and vomiting.  Genitourinary:  Negative for dysuria, flank pain, frequency, hematuria and urgency.  Musculoskeletal:  Negative for back pain, joint pain and myalgias.  Skin:  Negative for rash.  Neurological:  Negative for dizziness, tingling, focal weakness, seizures, weakness and headaches.  Endo/Heme/Allergies:  Does not bruise/bleed easily.  Psychiatric/Behavioral:  Negative for depression and suicidal ideas. The patient does not have insomnia.       Allergies  Allergen Reactions   Ziprasidone Hcl Other (See Comments)     Past Medical History:  Diagnosis Date   Anemia    Cataract    right eye getting it fixed 06/11/2021   Chronic kidney disease    Depression    Diabetes mellitus without complication (HCC)    GERD (gastroesophageal reflux disease)    Gout    Hypercholesteremia    Hypertension    Monoclonal gammopathy    Schizophrenia (HCC)    pt denies this  dx   Stroke [facility])      Past Surgical History:  Procedure Laterality Date   CATARACT EXTRACTION W/PHACO Left 03/11/2016   Procedure: CATARACT EXTRACTION PHACO AND INTRAOCULAR LENS PLACEMENT (IOC);  Surgeon: Lockie Mola, MD;  Location: [facility];  Service: Ophthalmology;  Laterality: Left;  DIABETIC - insulin   COLONOSCOPY WITH PROPOFOL N/A  01/26/2022   Procedure: COLONOSCOPY WITH PROPOFOL;  Surgeon: Midge Minium, MD;  Location: [facility] ENDOSCOPY;  Service: Endoscopy;  Laterality: N/A;   ESOPHAGOGASTRODUODENOSCOPY N/A 01/26/2022   Procedure: ESOPHAGOGASTRODUODENOSCOPY (EGD);  Surgeon: Midge Minium, MD;  Location[facility] ENDOSCOPY;  Service: Endoscopy;  Laterality: N/A;   ORIF TIBIA PLATEAU Right 09/05/06   [facility]   TUBAL LIGATION      Social History   Socioeconomic History   Marital status: Divorced    Spouse name: Not on file   Number of children: Not on file   Years of education: Not on file   Highest education level: Not on file  Occupational History   Not on file  Tobacco Use   Smoking status: Former    Current packs/day: 0.00    Average packs/day: 0.5 packs/day for 5.0 years (2.5 ttl pk-yrs)    Types: Cigarettes    Start date: 2016    Quit date: 2021    Years since quitting: 3.7   Smokeless tobacco: Former    Types: Snuff   Tobacco comments:    Quit 2 years ago. At her heaviest she smoked 2 packs a week.  Vaping Use   Vaping status: Never Used  Substance and Sexual Activity   Alcohol use: Not Currently    Alcohol/week: 1.0 standard drink of alcohol    Types: 1 Cans of beer per week    Comment: rarely   Drug use: No   Sexual activity: Never  Other Topics Concern   Not on file  Social History Narrative   Not on file   Social Determinants of Health   Financial Resource Strain: Not on file  Food Insecurity: Not on file  Transportation Needs: Not on file  Physical Activity: Not on file  Stress: Not on file  Social Connections: Not on file  Intimate Partner Violence: Not on file    Family History  Problem Relation Age of Onset   Diabetes Sister    Breast cancer Neg Hx      Current Outpatient Medications:    ACCU-CHEK AVIVA PLUS test strip, 1 each daily., Disp: , Rfl:    aluminum-magnesium hydroxide-simethicone (MAALOX) 200-200-20 MG/5ML SUSP, Take 30 mLs by mouth 4 (four) times daily -  before meals  and at bedtime., Disp: 355 mL, Rfl: 0   aspirin 81 MG chewable tablet, Chew 81 mg by mouth daily., Disp: , Rfl:    atorvastatin (LIPITOR) 80 MG tablet, Take 1 tablet (80 mg total) by mouth daily., Disp: 90 tablet, Rfl: 3   Fe Fum-Fe Poly-Vit C-Lactobac (FUSION PO), Take 1 capsule by mouth 3 (three) times a week., Disp: , Rfl:    JARDIANCE 10 MG TABS tablet, Take 10 mg by mouth daily., Disp: , Rfl:    LANTUS SOLOSTAR 100 UNIT/ML Solostar Pen, Inject 10 Units into the skin at bedtime., Disp: , Rfl:    lisinopril (ZESTRIL) 2.5 MG tablet, Take 2.5 mg by mouth daily., Disp: , Rfl:    Misc. Devices (WALKER) MISC, 1 each by Does not apply route daily as needed., Disp: 1 each, Rfl: 0   omeprazole (PRILOSEC) 40 MG capsule, Take  1 capsule by mouth daily., Disp: , Rfl:    Vitamin D, Cholecalciferol, 25 MCG (1000 UT) CAPS, Take 1 capsule by mouth daily., Disp: , Rfl:   Physical exam:  Vitals:   09/22/23 1114  BP: (!) 148/76  Pulse: 70  Resp: 18  Temp: (!) 96.4 F (35.8 C)  TempSrc: Tympanic  SpO2: 98%  Weight: 174 lb 1.6 oz (79 kg)  Height: 5\' 5"  (1.651 m)   Physical Exam Constitutional:      Comments: Sitting in a wheelchair.  Appears in no acute distress  Cardiovascular:     Rate and Rhythm: Normal rate and regular rhythm.     Heart sounds: Normal heart sounds.  Pulmonary:     Effort: Pulmonary effort is normal.     Breath sounds: Normal breath sounds.  Skin:    General: Skin is warm and dry.  Neurological:     Mental Status: She is alert and oriented to person, place, and time.         Latest Ref Rng & Units 09/22/2023   10:56 AM  CMP  Glucose 70 - 99 mg/dL 161   BUN 8 - 23 mg/dL 59   Creatinine 0.96 - 1.00 mg/dL 0.45   Sodium 409 - 811 mmol/L 136   Potassium 3.5 - 5.1 mmol/L 4.7   Chloride 98 - 111 mmol/L 109   CO2 22 - 32 mmol/L 22   Calcium 8.9 - 10.3 mg/dL 8.9       Latest Ref Rng & Units 09/22/2023   10:56 AM  CBC  WBC 4.0 - 10.5 K/uL 7.4   Hemoglobin 12.0 - 15.0  g/dL 9.9   Hematocrit 91.4 - 46.0 % 30.0   Platelets 150 - 400 K/uL 259     Assessment and plan- Patient is a 72 y.o. female here for routine follow-up of anemia of chronic kidney disease  Patient has not required any EPO injections for anemia of chronic kidney disease so far.  Hemoglobin remains more than 9 and stable between 9.5-10.5.Ferritin levels are more than 100 at 192 with an iron saturation of 31%.  She does not require any IV iron at this time.  She has a history of IgG MGUS which I will continue to follow with labs once a year.  CBC ferritin and iron studies in 3 and 6 months and I will see her back in 6 months with CBC with differential CMP myeloma panel and free light chains   Visit Diagnosis 1. Anemia of chronic kidney failure, stage 3 (moderate) (HCC)   2. MGUS (monoclonal gammopathy of unknown significance)      Dr. Owens Shark, MD, MPH [facility] at [facility] [phone] 09/22/2023 3:43 PM

## 2023-09-30 ENCOUNTER — Other Ambulatory Visit: Payer: Self-pay | Admitting: Diagnostic Radiology

## 2023-09-30 ENCOUNTER — Encounter: Payer: Self-pay | Admitting: Oncology

## 2023-09-30 ENCOUNTER — Ambulatory Visit
Admission: RE | Admit: 2023-09-30 | Discharge: 2023-09-30 | Disposition: A | Discharge status: Home / Self Care | Payer: 59 | Source: Ambulatory Visit | Attending: Internal Medicine | Admitting: Internal Medicine

## 2023-09-30 DIAGNOSIS — R269 Unspecified abnormalities of gait and mobility: Secondary | ICD-10-CM

## 2023-09-30 DIAGNOSIS — M25511 Pain in right shoulder: Secondary | ICD-10-CM

## 2023-09-30 DIAGNOSIS — M25551 Pain in right hip: Secondary | ICD-10-CM

## 2023-10-04 ENCOUNTER — Ambulatory Visit
Admission: RE | Admit: 2023-10-04 | Discharge: 2023-10-04 | Disposition: A | Discharge status: Home / Self Care | Payer: 59 | Source: Ambulatory Visit | Attending: Student in an Organized Health Care Education/Training Program | Admitting: Student in an Organized Health Care Education/Training Program

## 2023-10-04 DIAGNOSIS — R918 Other nonspecific abnormal finding of lung field: Secondary | ICD-10-CM | POA: Diagnosis present

## 2023-10-04 DIAGNOSIS — R0602 Shortness of breath: Secondary | ICD-10-CM | POA: Insufficient documentation

## 2023-10-15 ENCOUNTER — Telehealth: Payer: Self-pay | Admitting: Student in an Organized Health Care Education/Training Program

## 2023-10-15 NOTE — Telephone Encounter (Signed)
Call report on ct results

## 2023-11-10 ENCOUNTER — Ambulatory Visit: Payer: 59 | Admitting: Student in an Organized Health Care Education/Training Program

## 2023-11-15 ENCOUNTER — Ambulatory Visit: Payer: 59 | Admitting: Student in an Organized Health Care Education/Training Program

## 2023-12-20 ENCOUNTER — Inpatient Hospital Stay: Payer: 59 | Attending: Oncology

## 2023-12-20 DIAGNOSIS — Z79899 Other long term (current) drug therapy: Secondary | ICD-10-CM | POA: Diagnosis not present

## 2023-12-20 DIAGNOSIS — N183 Chronic kidney disease, stage 3 unspecified: Secondary | ICD-10-CM | POA: Diagnosis not present

## 2023-12-20 DIAGNOSIS — D472 Monoclonal gammopathy: Secondary | ICD-10-CM | POA: Insufficient documentation

## 2023-12-20 DIAGNOSIS — D631 Anemia in chronic kidney disease: Secondary | ICD-10-CM | POA: Diagnosis not present

## 2023-12-20 LAB — CBC
HCT: 29.5 % — ABNORMAL LOW (ref 36.0–46.0)
Hemoglobin: 9.9 g/dL — ABNORMAL LOW (ref 12.0–15.0)
MCH: 30.7 pg (ref 26.0–34.0)
MCHC: 33.6 g/dL (ref 30.0–36.0)
MCV: 91.6 fL (ref 80.0–100.0)
Platelets: 229 10*3/uL (ref 150–400)
RBC: 3.22 MIL/uL — ABNORMAL LOW (ref 3.87–5.11)
RDW: 12 % (ref 11.5–15.5)
WBC: 8.9 10*3/uL (ref 4.0–10.5)
nRBC: 0 % (ref 0.0–0.2)

## 2023-12-20 LAB — IRON AND TIBC
Iron: 59 ug/dL (ref 28–170)
Saturation Ratios: 26 % (ref 10.4–31.8)
TIBC: 230 ug/dL — ABNORMAL LOW (ref 250–450)
UIBC: 171 ug/dL

## 2023-12-20 LAB — FERRITIN: Ferritin: 191 ng/mL (ref 11–307)

## 2024-02-07 ENCOUNTER — Encounter: Payer: Self-pay | Admitting: Oncology

## 2024-03-01 ENCOUNTER — Encounter: Payer: Self-pay | Admitting: Oncology

## 2024-03-08 ENCOUNTER — Encounter: Payer: Self-pay | Admitting: Student in an Organized Health Care Education/Training Program

## 2024-03-08 ENCOUNTER — Ambulatory Visit: Payer: 59 | Admitting: Student in an Organized Health Care Education/Training Program

## 2024-03-21 ENCOUNTER — Encounter: Payer: Self-pay | Admitting: Oncology

## 2024-03-21 ENCOUNTER — Inpatient Hospital Stay (HOSPITAL_BASED_OUTPATIENT_CLINIC_OR_DEPARTMENT_OTHER): Payer: 59 | Admitting: Oncology

## 2024-03-21 ENCOUNTER — Inpatient Hospital Stay: Payer: 59 | Attending: Oncology

## 2024-03-21 VITALS — BP 159/77 | HR 70 | Temp 95.2°F | Resp 19 | Ht 65.0 in | Wt 191.0 lb

## 2024-03-21 DIAGNOSIS — Z87891 Personal history of nicotine dependence: Secondary | ICD-10-CM | POA: Diagnosis not present

## 2024-03-21 DIAGNOSIS — D472 Monoclonal gammopathy: Secondary | ICD-10-CM

## 2024-03-21 DIAGNOSIS — N184 Chronic kidney disease, stage 4 (severe): Secondary | ICD-10-CM | POA: Diagnosis not present

## 2024-03-21 DIAGNOSIS — I129 Hypertensive chronic kidney disease with stage 1 through stage 4 chronic kidney disease, or unspecified chronic kidney disease: Secondary | ICD-10-CM | POA: Diagnosis not present

## 2024-03-21 DIAGNOSIS — Z8673 Personal history of transient ischemic attack (TIA), and cerebral infarction without residual deficits: Secondary | ICD-10-CM | POA: Diagnosis not present

## 2024-03-21 DIAGNOSIS — E1122 Type 2 diabetes mellitus with diabetic chronic kidney disease: Secondary | ICD-10-CM | POA: Insufficient documentation

## 2024-03-21 DIAGNOSIS — Z79899 Other long term (current) drug therapy: Secondary | ICD-10-CM | POA: Insufficient documentation

## 2024-03-21 DIAGNOSIS — Z9851 Tubal ligation status: Secondary | ICD-10-CM | POA: Insufficient documentation

## 2024-03-21 DIAGNOSIS — D631 Anemia in chronic kidney disease: Secondary | ICD-10-CM | POA: Insufficient documentation

## 2024-03-21 DIAGNOSIS — Z833 Family history of diabetes mellitus: Secondary | ICD-10-CM | POA: Diagnosis not present

## 2024-03-21 DIAGNOSIS — N183 Chronic kidney disease, stage 3 unspecified: Secondary | ICD-10-CM

## 2024-03-21 LAB — CBC
HCT: 29.6 % — ABNORMAL LOW (ref 36.0–46.0)
Hemoglobin: 9.8 g/dL — ABNORMAL LOW (ref 12.0–15.0)
MCH: 30.1 pg (ref 26.0–34.0)
MCHC: 33.1 g/dL (ref 30.0–36.0)
MCV: 90.8 fL (ref 80.0–100.0)
Platelets: 267 10*3/uL (ref 150–400)
RBC: 3.26 MIL/uL — ABNORMAL LOW (ref 3.87–5.11)
RDW: 12.3 % (ref 11.5–15.5)
WBC: 8.6 10*3/uL (ref 4.0–10.5)
nRBC: 0 % (ref 0.0–0.2)

## 2024-03-21 LAB — COMPREHENSIVE METABOLIC PANEL
ALT: 9 U/L (ref 0–44)
AST: 13 U/L — ABNORMAL LOW (ref 15–41)
Albumin: 3 g/dL — ABNORMAL LOW (ref 3.5–5.0)
Alkaline Phosphatase: 66 U/L (ref 38–126)
Anion gap: 7 (ref 5–15)
BUN: 81 mg/dL — ABNORMAL HIGH (ref 8–23)
CO2: 22 mmol/L (ref 22–32)
Calcium: 8.8 mg/dL — ABNORMAL LOW (ref 8.9–10.3)
Chloride: 110 mmol/L (ref 98–111)
Creatinine, Ser: 3.14 mg/dL — ABNORMAL HIGH (ref 0.44–1.00)
GFR, Estimated: 15 mL/min — ABNORMAL LOW (ref >60)
Glucose, Bld: 147 mg/dL — ABNORMAL HIGH (ref 70–99)
Potassium: 4.7 mmol/L (ref 3.5–5.1)
Sodium: 139 mmol/L (ref 135–145)
Total Bilirubin: 0.6 mg/dL (ref 0.0–1.2)
Total Protein: 7.2 g/dL (ref 6.5–8.1)

## 2024-03-21 LAB — FERRITIN: Ferritin: 241 ng/mL (ref 11–307)

## 2024-03-21 LAB — IRON AND TIBC
Iron: 79 ug/dL (ref 28–170)
Saturation Ratios: 31 % (ref 10.4–31.8)
TIBC: 252 ug/dL (ref 250–450)
UIBC: 173 ug/dL

## 2024-03-21 NOTE — Progress Notes (Signed)
 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  She has now been referred to reestablish care.   Results of bloodwork from 03/09/2017 were as follows: CBC showed white count of 9.6, H&H of 10.3/29.9 and a platelet count of 301. CMP was significant for BUN of 39 and creatinine of 1.56. Reticulocyte count was 1.4% and low for the degree of anemia. Ferritin was normal at 51 and iron studies are within normal limits. B12 and folate were within normal limits. TSH was normal at 1.49. Haptoglobin was normal at 187. ESR was elevated at 70. Multiple myeloma panel showed elevated IgG of 1852. Serum monoclonal protein of 0.7 g was noted. Lambda light chain specificity. LDH was normal at 135 and beta-2 microglobulin was elevated at 4.2. 24-hour urine protein revealed 1gm proteinuria and 39 mg monoclonal protein in 24 hours.    Bone marrow biopsy on 04/20/17 showed normocellular marrow with trilineage hematopoiesis and mild increase in plasma cells 9% (5-10%)   Most recent labs from 12/16/2020 showed white count of 7.5, H&H of 10.6/31.9 with a platelet count of 288.  Serum creatinine was elevated at 1.5.  Serum calcium was normal at 9.4 and total protein normal at 7.1.  M spike was 0.9 on serum protein electrophoresis.  Both kappa and lambda free light chains were elevated with a normal free light chain ratio likely secondary to  CKD  Interval history-she is doing well for her age.  No recent hospitalizations.  She follows up with nephrology for her CKD.  She has baseline fatigue  ECOG PS- 2 Pain scale- 0   Review of systems- Review of Systems  Constitutional:  Positive for malaise/fatigue. Negative for chills, fever and weight loss.  HENT:  Negative for congestion, ear discharge and nosebleeds.   Eyes:  Negative for blurred vision.  Respiratory:  Negative for cough, hemoptysis, sputum production, shortness of breath and wheezing.   Cardiovascular:  Negative for chest pain, palpitations, orthopnea and claudication.  Gastrointestinal:  Negative for abdominal pain, blood in stool, constipation, diarrhea, heartburn, melena, nausea and vomiting.  Genitourinary:  Negative for dysuria, flank pain, frequency, hematuria and urgency.  Musculoskeletal:  Negative for back pain, joint pain and myalgias.  Skin:  Negative for rash.  Neurological:  Negative for dizziness, tingling, focal weakness, seizures, weakness and headaches.  Endo/Heme/Allergies:  Does not bruise/bleed easily.  Psychiatric/Behavioral:  Negative for depression and suicidal ideas. The patient does not have insomnia.       Allergies  Allergen Reactions   Ziprasidone Hcl Other (See Comments)     Past Medical History:  Diagnosis Date   Anemia    Cataract    right eye getting it fixed 06/11/2021   Chronic kidney disease    Depression    Diabetes mellitus without complication (HCC)    GERD (gastroesophageal reflux disease)    Gout    Hypercholesteremia    Hypertension    Monoclonal gammopathy  Schizophrenia (HCC)    pt denies this dx   Stroke [facility])      Past Surgical History:  Procedure Laterality Date   CATARACT EXTRACTION W/PHACO Left 03/11/2016   Procedure: CATARACT EXTRACTION PHACO AND INTRAOCULAR LENS PLACEMENT (IOC);  Surgeon: Lockie Mola, MD;  Location: [facility];  Service: Ophthalmology;  Laterality: Left;   DIABETIC - insulin   COLONOSCOPY WITH PROPOFOL N/A 01/26/2022   Procedure: COLONOSCOPY WITH PROPOFOL;  Surgeon: Midge Minium, MD;  Location: [facility] ENDOSCOPY;  Service: Endoscopy;  Laterality: N/A;   ESOPHAGOGASTRODUODENOSCOPY N/A 01/26/2022   Procedure: ESOPHAGOGASTRODUODENOSCOPY (EGD);  Surgeon: Midge Minium, MD;  Location: [facility];  Service: Endoscopy;  Laterality: N/A;   ORIF TIBIA PLATEAU Right 09/05/06   [facility]   TUBAL LIGATION      Social History   Socioeconomic History   Marital status: Divorced    Spouse name: Not on file   Number of children: Not on file   Years of education: Not on file   Highest education level: Not on file  Occupational History   Not on file  Tobacco Use   Smoking status: Former    Current packs/day: 0.00    Average packs/day: 0.5 packs/day for 5.0 years (2.5 ttl pk-yrs)    Types: Cigarettes    Start date: 2016    Quit date: 2021    Years since quitting: 4.2   Smokeless tobacco: Former    Types: Snuff   Tobacco comments:    Quit 2 years ago. At her heaviest she smoked 2 packs a week.  Vaping Use   Vaping status: Never Used  Substance and Sexual Activity   Alcohol use: Not Currently    Alcohol/week: 1.0 standard drink of alcohol    Types: 1 Cans of beer per week    Comment: rarely   Drug use: No   Sexual activity: Never  Other Topics Concern   Not on file  Social History Narrative   Not on file   Social Drivers of Health   Financial Resource Strain: Not on file  Food Insecurity: Not on file  Transportation Needs: Not on file  Physical Activity: Not on file  Stress: Not on file  Social Connections: Not on file  Intimate Partner Violence: Not on file    Family History  Problem Relation Age of Onset   Diabetes Sister    Breast cancer Neg Hx      Current Outpatient Medications:    ACCU-CHEK AVIVA PLUS test strip, 1 each daily., Disp: , Rfl:    aluminum-magnesium hydroxide-simethicone (MAALOX) 200-200-20 MG/5ML SUSP, Take 30 mLs  by mouth 4 (four) times daily -  before meals and at bedtime., Disp: 355 mL, Rfl: 0   aspirin 81 MG chewable tablet, Chew 81 mg by mouth daily., Disp: , Rfl:    atorvastatin (LIPITOR) 80 MG tablet, Take 1 tablet (80 mg total) by mouth daily., Disp: 90 tablet, Rfl: 3   Fe Fum-Fe Poly-Vit C-Lactobac (FUSION PO), Take 1 capsule by mouth 3 (three) times a week., Disp: , Rfl:    JARDIANCE 10 MG TABS tablet, Take 10 mg by mouth daily., Disp: , Rfl:    LANTUS SOLOSTAR 100 UNIT/ML Solostar Pen, Inject 10 Units into the skin at bedtime., Disp: , Rfl:    lisinopril (ZESTRIL) 2.5 MG tablet, Take 2.5 mg by mouth daily., Disp: , Rfl:    Misc. Devices (WALKER) MISC, 1 each by Does not apply route daily as needed., Disp: 1 each, Rfl: 0  omeprazole (PRILOSEC) 40 MG capsule, Take 1 capsule by mouth daily., Disp: , Rfl:    Vitamin D, Cholecalciferol, 25 MCG (1000 UT) CAPS, Take 1 capsule by mouth daily., Disp: , Rfl:   Physical exam:  Vitals:   03/21/24 0929  BP: (!) 159/77  Pulse: 70  Resp: 19  Temp: (!) 95.2 F (35.1 C)  TempSrc: Tympanic  SpO2: 99%  Weight: 191 lb (86.6 kg)  Height: 5\' 5"  (1.651 m)   Physical Exam Cardiovascular:     Rate and Rhythm: Normal rate and regular rhythm.     Heart sounds: Normal heart sounds.  Pulmonary:     Effort: Pulmonary effort is normal.     Breath sounds: Normal breath sounds.  Skin:    General: Skin is warm and dry.  Neurological:     Mental Status: She is alert and oriented to person, place, and time.         Latest Ref Rng & Units 03/21/2024    9:17 AM  CMP  Glucose 70 - 99 mg/dL 130   BUN 8 - 23 mg/dL 81   Creatinine 8.65 - 1.00 mg/dL 7.84   Sodium 696 - 295 mmol/L 139   Potassium 3.5 - 5.1 mmol/L 4.7   Chloride 98 - 111 mmol/L 110   CO2 22 - 32 mmol/L 22   Calcium 8.9 - 10.3 mg/dL 8.8   Total Protein 6.5 - 8.1 g/dL 7.2   Total Bilirubin 0.0 - 1.2 mg/dL 0.6   Alkaline Phos 38 - 126 U/L 66   AST 15 - 41 U/L 13   ALT 0 - 44 U/L 9        Latest Ref Rng & Units 03/21/2024    9:17 AM  CBC  WBC 4.0 - 10.5 K/uL 8.6   Hemoglobin 12.0 - 15.0 g/dL 9.8   Hematocrit 28.4 - 46.0 % 29.6   Platelets 150 - 400 K/uL 267     Assessment and plan- Patient is a 73 y.o. female here for routine follow-up of anemia of chronic kidney disease  Patient's hemoglobin has remained stable between 9.5-10 over the last 1 year.  Ferritin levels are presently normal with a ferritin of more than 100 and iron saturation of more than 20%.  She does not require any IV iron at this time.  Given the stability of her hemoglobin I will plan to repeat CBC ferritin and iron studies in 4 and 8 months and see her back in 8 months   Visit Diagnosis 1. Anemia of chronic kidney failure, stage 4 (severe) (HCC)      Dr. Owens Shark, MD, MPH [facility] at [facility] [phone] 03/21/2024 12:16 PM

## 2024-03-22 LAB — KAPPA/LAMBDA LIGHT CHAINS
Kappa free light chain: 67.8 mg/L — ABNORMAL HIGH (ref 3.3–19.4)
Kappa, lambda light chain ratio: 0.71 (ref 0.26–1.65)
Lambda free light chains: 95.6 mg/L — ABNORMAL HIGH (ref 5.7–26.3)

## 2024-03-23 LAB — MULTIPLE MYELOMA PANEL, SERUM
Albumin SerPl Elph-Mcnc: 2.9 g/dL (ref 2.9–4.4)
Albumin/Glob SerPl: 0.8 (ref 0.7–1.7)
Alpha 1: 0.2 g/dL (ref 0.0–0.4)
Alpha2 Glob SerPl Elph-Mcnc: 0.8 g/dL (ref 0.4–1.0)
B-Globulin SerPl Elph-Mcnc: 1 g/dL (ref 0.7–1.3)
Gamma Glob SerPl Elph-Mcnc: 1.7 g/dL (ref 0.4–1.8)
Globulin, Total: 3.7 g/dL (ref 2.2–3.9)
IgA: 193 mg/dL (ref 87–352)
IgG (Immunoglobin G), Serum: 1921 mg/dL — ABNORMAL HIGH (ref 586–1602)
IgM (Immunoglobulin M), Srm: 139 mg/dL (ref 26–217)
M Protein SerPl Elph-Mcnc: 0.8 g/dL — ABNORMAL HIGH
Total Protein ELP: 6.6 g/dL (ref 6.0–8.5)

## 2024-04-28 ENCOUNTER — Ambulatory Visit: Attending: Nurse Practitioner | Admitting: Nurse Practitioner

## 2024-04-28 ENCOUNTER — Encounter: Payer: Self-pay | Admitting: Nurse Practitioner

## 2024-04-28 VITALS — BP 126/64 | HR 70 | Ht 63.0 in | Wt 190.0 lb

## 2024-04-28 DIAGNOSIS — E1122 Type 2 diabetes mellitus with diabetic chronic kidney disease: Secondary | ICD-10-CM | POA: Diagnosis not present

## 2024-04-28 DIAGNOSIS — I10 Essential (primary) hypertension: Secondary | ICD-10-CM

## 2024-04-28 DIAGNOSIS — I2510 Atherosclerotic heart disease of native coronary artery without angina pectoris: Secondary | ICD-10-CM

## 2024-04-28 DIAGNOSIS — E785 Hyperlipidemia, unspecified: Secondary | ICD-10-CM | POA: Diagnosis not present

## 2024-04-28 DIAGNOSIS — N184 Chronic kidney disease, stage 4 (severe): Secondary | ICD-10-CM

## 2024-04-28 DIAGNOSIS — Z794 Long term (current) use of insulin: Secondary | ICD-10-CM

## 2024-04-28 MED ORDER — ATORVASTATIN CALCIUM 80 MG PO TABS: 80.0000 mg | ORAL_TABLET | Freq: Every day | ORAL | 3 refills | Status: AC

## 2024-04-28 MED ORDER — ASPIRIN 81 MG PO CHEW: 81.0000 mg | CHEWABLE_TABLET | Freq: Every day | ORAL | Status: AC

## 2024-04-28 NOTE — Progress Notes (Signed)
 Office Visit    Patient Name: Jennifer Wood Date of Encounter: 04/28/2024  Primary Care Provider:  [facility] Primary Cardiologist:  Constancia Delton, MD  Chief Complaint    73 y.o. female with a history of three-vessel coronary calcium  previously noted on CT, hypertension, hyperlipidemia, diabetes, stage IV chronic kidney disease, diastolic dysfunction, anemia of chronic disease, pulmonary nodules, GERD, and hiatal hernia, who presents for follow-up related to hypertension.  Past Medical History  Subjective   Past Medical History:  Diagnosis Date   Anemia    Cataract    right eye getting it fixed 06/11/2021   Chest pain    a. 12/2022 MV: EF 72%. no ischemia/infarct. 3 vessel cor Ca2+. Mild Ao atherosclerosis.   CKD (chronic kidney disease) stage 4, GFR 15-29 ml/min (HCC)    Coronary artery calcification    a. 12/2022 MV: CT attenuation correction images with three-vessel coronary artery calcification mild aortic atherosclerosis.   Depression    Diabetes mellitus without complication (HCC)    Diastolic dysfunction    a. 12/2022 Echo: EF 60-65%. No rwma, GrI DD, nl RV fxn, mild MR.   GERD (gastroesophageal reflux disease)    Gout    Hypercholesteremia    Hypertension    Monoclonal gammopathy    Pulmonary nodules    a. 09/2023 CT chest: No fibrotic ILD.  Multiple pulmonary nodules up to 5 mm in the left upper lobe-nonspecific.   Schizophrenia (HCC)    pt denies this dx   Stroke [facility])    Past Surgical History:  Procedure Laterality Date   CATARACT EXTRACTION W/PHACO Left 03/11/2016   Procedure: CATARACT EXTRACTION PHACO AND INTRAOCULAR LENS PLACEMENT (IOC);  Surgeon: Annell Kidney, MD;  Location: [facility];  Service: Ophthalmology;  Laterality: Left;  DIABETIC - insulin   COLONOSCOPY WITH PROPOFOL  N/A 01/26/2022   Procedure: COLONOSCOPY WITH PROPOFOL ;  Surgeon: Marnee Sink, MD;  Location: [facility] ENDOSCOPY;  Service: Endoscopy;  Laterality:  N/A;   ESOPHAGOGASTRODUODENOSCOPY N/A 01/26/2022   Procedure: ESOPHAGOGASTRODUODENOSCOPY (EGD);  Surgeon: Marnee Sink, MD;  Location: [facility] ENDOSCOPY;  Service: Endoscopy;  Laterality: N/A;   ORIF TIBIA PLATEAU Right 09/05/06   [facility]   TUBAL LIGATION      Allergies  Allergies  Allergen Reactions   Atorvastatin  Other (See Comments)    Muscle pain, patient prefers to continue taking statin. Dose halved.   Ziprasidone Other (See Comments)   Ziprasidone Hcl Other (See Comments)      History of Present Illness      73 y.o. y/o female with a history of three-vessel coronary calcium  previously noted on CT, hypertension, hyperlipidemia, diabetes, stage IV chronic kidney disease, anemia of chronic disease, diastolic dysfunction, pulmonary nodules, GERD, and hiatal hernia.  In the setting of prior history of pulmonary nodules, nasal Jennifer Wood had a CT of her chest in October 2023 which showed heavy three-vessel coronary calcifications.  She established care with Dr. Junnie Olives and reported epigastric chest pain.  Decision was made to pursue Lexiscan  Myoview , which showed normal LV function without ischemia or infarct.  An echocardiogram was also performed and showed an EF of 60 to 65% with grade 1 diastolic dysfunction and mild MR.   [name] was last seen in cardiology clinic in February 2024, at which time she was doing well.  Over the past year, she stopped taking aspirin, statin, and ACE inhibitor therapy.  She says that she changed her diet and figured she could come off of her  medicines.  BMI is 33.6.  Her blood pressure is controlled today at 126/64.  With a long discussion about the indication of aspirin and statin therapy in the setting of significant coronary calcium , and she was agreeable to resume.  She is not particularly active.  She has chronic right shoulder pain, which limits her activity some.  She plans on seeing an orthopedist.  She denies chest pain, dyspnea, palpitations, PND, orthopnea,  dizziness, syncope, edema, or early satiety. Objective  Home Medications    Current Outpatient Medications  Medication Sig Dispense Refill   ACCU-CHEK AVIVA PLUS test strip 1 each daily.     JARDIANCE 10 MG TABS tablet Take 10 mg by mouth daily.     LANTUS SOLOSTAR 100 UNIT/ML Solostar Pen Inject 10 Units into the skin at bedtime.     Misc. Devices (WALKER) MISC 1 each by Does not apply route daily as needed. 1 each 0   aspirin 81 MG chewable tablet Chew 1 tablet (81 mg total) by mouth daily.     atorvastatin  (LIPITOR) 80 MG tablet Take 1 tablet (80 mg total) by mouth daily. 90 tablet 3   No current facility-administered medications for this visit.     Physical Exam    VS:  BP 126/64 (BP Location: Left Arm, Patient Position: Sitting)   Pulse 70   Ht 5\' 3"  (1.6 m)   Wt 190 lb (86.2 kg)   SpO2 97%   BMI 33.66 kg/m  , BMI Body mass index is 33.66 kg/m.       GEN: Well nourished, well developed, in no acute distress. HEENT: Poor dentition. Neck: Supple, no JVD, carotid bruits, or masses. Cardiac: RRR, no murmurs, rubs, or gallops. No clubbing, cyanosis, edema.  Radials 2+/PT 2+ and equal bilaterally.  Respiratory:  Respirations regular and unlabored, clear to auscultation bilaterally. GI: Soft, nontender, nondistended, BS + x 4. MS: no deformity or atrophy. Skin: warm and dry, no rash. Neuro:  Strength and sensation are intact. Psych: Normal affect.  Accessory Clinical Findings    ECG personally reviewed by me today - EKG Interpretation Date/Time:  Friday Apr 28 2024 10:30:42 EDT Ventricular Rate:  70 PR Interval:  142 QRS Duration:  132 QT Interval:  450 QTC Calculation: 486 R Axis:   -23  Text Interpretation: Normal sinus rhythm Right bundle branch block Minimal voltage criteria for LVH, may be normal variant ( R in aVL ) Confirmed by Laneta Pintos [phone]) on 04/28/2024 10:32:16 AM  - no acute changes.  Lab Results  Component Value Date   WBC 8.6 03/21/2024    HGB 9.8 (L) 03/21/2024   HCT 29.6 (L) 03/21/2024   MCV 90.8 03/21/2024   PLT 267 03/21/2024   Lab Results  Component Value Date   CREATININE 3.14 (H) 03/21/2024   BUN 81 (H) 03/21/2024   NA 139 03/21/2024   K 4.7 03/21/2024   CL 110 03/21/2024   CO2 22 03/21/2024   Lab Results  Component Value Date   ALT 9 03/21/2024   AST 13 (L) 03/21/2024   ALKPHOS 66 03/21/2024   BILITOT 0.6 03/21/2024   Lab Results  Component Value Date   CHOL 165 01/29/2023   HDL 36 (L) 01/29/2023   LDLCALC 85 01/29/2023   TRIG 222 (H) 01/29/2023   CHOLHDL 4.6 01/29/2023    Lab Results  Component Value Date   TSH 2.057 01/23/2021       Assessment & Plan    1.  Primary  hypertension: Patient with a history hypertension on lisinopril therapy.  She has not been taking lisinopril and blood pressure stable today 126/64.  She does not want to resume lisinopril today.  I encouraged her to continue to follow blood pressure at home and follow-up with her primary care provider or nephrologist regarding resumption.  2.  Coronary calcium : Previously noted on CT with nonischemic stress testing in 2024.  She denies chest pain or dyspnea.  She was previously prescribed aspirin and statin therapy.  She came off of both at some point last year.  Notes indicate that statin therapy, muscle aches though she was advised to see if she can tolerate half a dose.  She is willing to resume aspirin and half dose statin.  3.  Hyperlipidemia: LDL of 85 last year at which time she was on atorvastatin .  She says she has been taking but is willing to resume half dose to see if she tolerates.  4.  DMII: On Lantus.  Managed by primary care.  5.  Stage IV chronic kidney disease: Followed by nephrology.  No longer taking lisinopril.  Strongly encouraged to follow-up with her primary care provider or nephrologist for further discussion.    6.  Anemia of chronic disease: H&H stable in March.  Followed by hematology.  7.  Disposition:  Follow-up in 1 year or sooner if necessary.  Laneta Pintos, NP 04/28/2024, 1:03 PM

## 2024-04-28 NOTE — Patient Instructions (Signed)
 Medication Instructions:  Your Physician recommend you continue on your current medication as directed.     Restart taking your 81 mg Aspirin one tablet per day; and take Atorvastatin  80 mg one tablet per day.  *If you need a refill on your cardiac medications before your next appointment, please call your pharmacy*  Follow-Up: At [facility], you and your health needs are our priority.  As part of our continuing mission to provide you with exceptional heart care, our providers are all part of one team.  This team includes your primary Cardiologist (physician) and Advanced Practice Providers or APPs (Physician Assistants and Nurse Practitioners) who all work together to provide you with the care you need, when you need it.  Your next appointment:   12 month(s)  Provider:   You may see Constancia Delton, MD or one of the following Advanced Practice Providers on your designated Care Team:   Laneta Pintos, NP Gildardo Labrador, PA-C Varney Gentleman, PA-C Cadence Kleindale, PA-C Ronald Cockayne, NP Morey Ar, NP    We recommend signing up for the patient portal called "MyChart".  Sign up information is provided on this After Visit Summary.  MyChart is used to connect with patients for Virtual Visits (Telemedicine).  Patients are able to view lab/test results, encounter notes, upcoming appointments, etc.  Non-urgent messages can be sent to your provider as well.   To learn more about what you can do with MyChart, go to [email or web address].

## 2024-05-29 ENCOUNTER — Telehealth: Payer: Self-pay | Admitting: Cardiology

## 2024-05-29 NOTE — Telephone Encounter (Signed)
-----   Message from CMA Marina  L sent at 05/29/2024  3:47 PM EDT ----- Please see note below. ----- Message ----- From: Hollace Lund, RN Sent: 05/29/2024   3:44 PM EDT To: Marina  Laurene Pont, CMA  Hey! She should be okay from a medical standpoint. She can cancel her appointment for the 5th. If she needs to come in sooner she can. ----- Message ----- From: Arty Later, CMA Sent: 05/29/2024   3:00 PM EDT To: Gwendolyn H Slade; Hollace Lund, RN  Hello, Pt has f/u with [facility] 06/01/2024. Pt recently saw Larinda Plover 04/28/2024 and told to follow up in 12 months. Please advise if pt needs should keep appointment or push appointment out.  Thank you, Marina , CMA

## 2024-05-29 NOTE — Telephone Encounter (Signed)
 Left voicemail to advise patient of cancellation of 06/01/24 appointment

## 2024-06-01 ENCOUNTER — Ambulatory Visit: Admitting: Cardiology

## 2024-06-01 NOTE — Telephone Encounter (Signed)
 Left voicemail to advise patient of cancellation of 06/01/24 appointment since recently seen on 04/28/24. Per Dr. Junnie Olives, patient can be rescheduled but if she is having problems can be seen 6/5 per nurse/BAE.

## 2024-06-08 ENCOUNTER — Other Ambulatory Visit: Payer: Self-pay | Admitting: Internal Medicine

## 2024-06-08 DIAGNOSIS — M25511 Pain in right shoulder: Secondary | ICD-10-CM

## 2024-07-21 ENCOUNTER — Inpatient Hospital Stay: Attending: Oncology

## 2024-07-21 ENCOUNTER — Other Ambulatory Visit: Payer: Self-pay

## 2024-07-21 DIAGNOSIS — N184 Chronic kidney disease, stage 4 (severe): Secondary | ICD-10-CM | POA: Diagnosis not present

## 2024-07-21 DIAGNOSIS — D631 Anemia in chronic kidney disease: Secondary | ICD-10-CM | POA: Diagnosis present

## 2024-07-21 DIAGNOSIS — Z79899 Other long term (current) drug therapy: Secondary | ICD-10-CM | POA: Diagnosis not present

## 2024-07-21 LAB — CBC WITH DIFFERENTIAL (CANCER CENTER ONLY)
Abs Immature Granulocytes: 0.02 K/uL (ref 0.00–0.07)
Basophils Absolute: 0 K/uL (ref 0.0–0.1)
Basophils Relative: 1 %
Eosinophils Absolute: 0.1 K/uL (ref 0.0–0.5)
Eosinophils Relative: 2 %
HCT: 27.6 % — ABNORMAL LOW (ref 36.0–46.0)
Hemoglobin: 9.3 g/dL — ABNORMAL LOW (ref 12.0–15.0)
Immature Granulocytes: 0 %
Lymphocytes Relative: 31 %
Lymphs Abs: 2.5 K/uL (ref 0.7–4.0)
MCH: 30.5 pg (ref 26.0–34.0)
MCHC: 33.7 g/dL (ref 30.0–36.0)
MCV: 90.5 fL (ref 80.0–100.0)
Monocytes Absolute: 0.5 K/uL (ref 0.1–1.0)
Monocytes Relative: 7 %
Neutro Abs: 4.7 K/uL (ref 1.7–7.7)
Neutrophils Relative %: 59 %
Platelet Count: 246 K/uL (ref 150–400)
RBC: 3.05 MIL/uL — ABNORMAL LOW (ref 3.87–5.11)
RDW: 12.1 % (ref 11.5–15.5)
WBC Count: 7.9 K/uL (ref 4.0–10.5)
nRBC: 0 % (ref 0.0–0.2)

## 2024-07-21 LAB — IRON AND TIBC
Iron: 79 ug/dL (ref 28–170)
Saturation Ratios: 33 % — ABNORMAL HIGH (ref 10.4–31.8)
TIBC: 237 ug/dL — ABNORMAL LOW (ref 250–450)
UIBC: 158 ug/dL

## 2024-07-21 LAB — FERRITIN: Ferritin: 209 ng/mL (ref 11–307)

## 2024-08-22 ENCOUNTER — Emergency Department
Admission: EM | Admit: 2024-08-22 | Discharge: 2024-08-22 | Disposition: A | Discharge status: Home / Self Care | Attending: Emergency Medicine | Admitting: Emergency Medicine

## 2024-08-22 ENCOUNTER — Other Ambulatory Visit: Payer: Self-pay

## 2024-08-22 ENCOUNTER — Emergency Department

## 2024-08-22 ENCOUNTER — Encounter: Payer: Self-pay | Admitting: Emergency Medicine

## 2024-08-22 DIAGNOSIS — I129 Hypertensive chronic kidney disease with stage 1 through stage 4 chronic kidney disease, or unspecified chronic kidney disease: Secondary | ICD-10-CM | POA: Diagnosis not present

## 2024-08-22 DIAGNOSIS — G8929 Other chronic pain: Secondary | ICD-10-CM | POA: Diagnosis not present

## 2024-08-22 DIAGNOSIS — M25511 Pain in right shoulder: Secondary | ICD-10-CM | POA: Diagnosis present

## 2024-08-22 DIAGNOSIS — N189 Chronic kidney disease, unspecified: Secondary | ICD-10-CM | POA: Insufficient documentation

## 2024-08-22 DIAGNOSIS — E1122 Type 2 diabetes mellitus with diabetic chronic kidney disease: Secondary | ICD-10-CM | POA: Insufficient documentation

## 2024-08-22 LAB — CBG MONITORING, ED: Glucose-Capillary: 181 mg/dL — ABNORMAL HIGH (ref 70–99)

## 2024-08-22 MED ORDER — DEXAMETHASONE SODIUM PHOSPHATE 10 MG/ML IJ SOLN
10.0000 mg | Freq: Once | INTRAMUSCULAR | Status: AC
  Administered 2024-08-22: 10 mg via INTRAMUSCULAR
  Filled 2024-08-22: qty 1

## 2024-08-22 NOTE — ED Provider Notes (Signed)
 [facility] Provider Note    Event Date/Time   First MD Initiated Contact with Patient 08/22/24 1433     (approximate)   History   Shoulder Pain   HPI  Jennifer Wood is a 73 y.o. female presents to the ED via EMS from home with complaint of right shoulder pain that has been ongoing for 1 year.  Patient states she got a shot 2 months ago from her PCP which has given her no relief.  No medication specifically for her shoulder and no new injury.  Patient has history of schizophrenia, hypertension, diabetes, depression, chronic GERD, gout, CKD.      Physical Exam   Triage Vital Signs: ED Triage Vitals  Encounter Vitals Group     BP 08/22/24 1331 (!) 183/79     Girls Systolic BP Percentile --      Girls Diastolic BP Percentile --      Boys Systolic BP Percentile --      Boys Diastolic BP Percentile --      Pulse Rate 08/22/24 1331 80     Resp 08/22/24 1331 17     Temp 08/22/24 1331 97.7 F (36.5 C)     Temp Source 08/22/24 1331 Oral     SpO2 08/22/24 1331 100 %     Weight 08/22/24 1330 189 lb 9.5 oz (86 kg)     Height 08/22/24 1330 5' 3 (1.6 m)     Head Circumference --      Peak Flow --      Pain Score 08/22/24 1330 10     Pain Loc --      Pain Education --      Exclude from Growth Chart --     Most recent vital signs: Vitals:   08/22/24 1331 08/22/24 1525  BP: (!) 183/79   Pulse: 80   Resp: 17   Temp: 97.7 F (36.5 C)   SpO2: 100% 100%     General: Awake, no distress.  CV:  Good peripheral perfusion.  Resp:  Normal effort.  Abd:  No distention.  Other:  Right shoulder on exam no gross deformity.  Patient is able to extend her arm over her head without any restriction.  No difficulty with rotation or flexion.  Skin is intact.  No erythema or warmth is noted.  No crepitus with range of motion.  Radial pulse is present.   ED Results / Procedures / Treatments   Labs (all labs ordered are listed, but only abnormal results are  displayed) Labs Reviewed  CBG MONITORING, ED - Abnormal; Notable for the following components:      Result Value   Glucose-Capillary 181 (*)    All other components within normal limits      RADIOLOGY  Right shoulder x-ray images were reviewed and interpreted by myself independently radiologist and no acute bony abnormality was noted.   PROCEDURES:  Critical Care performed:   Procedures   MEDICATIONS ORDERED IN ED: Medications  dexamethasone  (DECADRON ) injection 10 mg (has no administration in time range)     IMPRESSION / MDM / ASSESSMENT AND PLAN / ED COURSE  I reviewed the triage vital signs and the nursing notes.   Differential diagnosis includes, but is not limited to, chronic right shoulder pain, bursitis, tendinitis, osteoarthritis..  73 year old female presents to the ED with complaint of right shoulder pain for 1 year with no recent injury to exacerbate her symptoms.  Patient currently is not taking any  over-the-counter medication for this.  Patient reports that she has a PCP and will make an appointment with them for follow-up.  She was also encouraged to call make an appointment with orthopedics for evaluation of her shoulder.  An injection of Decadron  10 mg IM was given while in the ED and patient was encouraged to take Tylenol  only if needed for pain.      Patient's presentation is most consistent with exacerbation of chronic illness.  FINAL CLINICAL IMPRESSION(S) / ED DIAGNOSES   Final diagnoses:  Chronic right shoulder pain     Rx / DC Orders   ED Discharge Orders     None        Note:  This document was prepared using Dragon voice recognition software and may include unintentional dictation errors.   Saunders Shona CROME, PA-C 08/22/24 1530    Willo Dunnings, MD 08/22/24 [phone]

## 2024-08-22 NOTE — ED Triage Notes (Signed)
 First Nurse Note: Patient to ED via [facility] from home for right shoulder pain. Ongoing x1 year, had shot for same 2 months ago with no relief.  168/77 100% RA 85 HR

## 2024-08-22 NOTE — ED Notes (Signed)
 Patient transported to X-ray

## 2024-08-22 NOTE — Discharge Instructions (Signed)
 Call make an appointment with your primary care provider if any continued problems and also for continued care of your diabetes.  An injection was given to you 1 time for your shoulder pain however you need to be evaluated by the orthopedist for your chronic shoulder pain.  You may also use ice or heat to your shoulder as needed for discomfort.  Continue with your diabetic diet and checking your diabetes often.

## 2024-11-20 ENCOUNTER — Other Ambulatory Visit

## 2024-11-20 ENCOUNTER — Ambulatory Visit: Admitting: Oncology

## 2024-11-21 ENCOUNTER — Other Ambulatory Visit: Payer: Self-pay | Admitting: *Deleted

## 2024-11-21 DIAGNOSIS — D631 Anemia in chronic kidney disease: Secondary | ICD-10-CM

## 2024-11-27 ENCOUNTER — Inpatient Hospital Stay: Attending: Oncology

## 2024-11-27 ENCOUNTER — Encounter: Payer: Self-pay | Admitting: Oncology

## 2024-11-27 ENCOUNTER — Inpatient Hospital Stay: Admitting: Oncology

## 2024-11-27 VITALS — BP 139/71 | HR 78 | Temp 98.6°F | Resp 20 | Wt 177.1 lb

## 2024-11-27 DIAGNOSIS — I129 Hypertensive chronic kidney disease with stage 1 through stage 4 chronic kidney disease, or unspecified chronic kidney disease: Secondary | ICD-10-CM | POA: Diagnosis not present

## 2024-11-27 DIAGNOSIS — Z9841 Cataract extraction status, right eye: Secondary | ICD-10-CM | POA: Insufficient documentation

## 2024-11-27 DIAGNOSIS — Z9842 Cataract extraction status, left eye: Secondary | ICD-10-CM | POA: Diagnosis not present

## 2024-11-27 DIAGNOSIS — Z7982 Long term (current) use of aspirin: Secondary | ICD-10-CM | POA: Insufficient documentation

## 2024-11-27 DIAGNOSIS — Z87891 Personal history of nicotine dependence: Secondary | ICD-10-CM | POA: Insufficient documentation

## 2024-11-27 DIAGNOSIS — Z888 Allergy status to other drugs, medicaments and biological substances status: Secondary | ICD-10-CM | POA: Insufficient documentation

## 2024-11-27 DIAGNOSIS — N184 Chronic kidney disease, stage 4 (severe): Secondary | ICD-10-CM | POA: Diagnosis not present

## 2024-11-27 DIAGNOSIS — Z833 Family history of diabetes mellitus: Secondary | ICD-10-CM | POA: Insufficient documentation

## 2024-11-27 DIAGNOSIS — Z79899 Other long term (current) drug therapy: Secondary | ICD-10-CM | POA: Insufficient documentation

## 2024-11-27 DIAGNOSIS — Z8673 Personal history of transient ischemic attack (TIA), and cerebral infarction without residual deficits: Secondary | ICD-10-CM | POA: Diagnosis not present

## 2024-11-27 DIAGNOSIS — D631 Anemia in chronic kidney disease: Secondary | ICD-10-CM

## 2024-11-27 LAB — CBC WITH DIFFERENTIAL (CANCER CENTER ONLY)
Abs Immature Granulocytes: 0.01 K/uL (ref 0.00–0.07)
Basophils Absolute: 0 K/uL (ref 0.0–0.1)
Basophils Relative: 1 %
Eosinophils Absolute: 0.1 K/uL (ref 0.0–0.5)
Eosinophils Relative: 2 %
HCT: 26.7 % — ABNORMAL LOW (ref 36.0–46.0)
Hemoglobin: 9.1 g/dL — ABNORMAL LOW (ref 12.0–15.0)
Immature Granulocytes: 0 %
Lymphocytes Relative: 30 %
Lymphs Abs: 2.4 K/uL (ref 0.7–4.0)
MCH: 30.5 pg (ref 26.0–34.0)
MCHC: 34.1 g/dL (ref 30.0–36.0)
MCV: 89.6 fL (ref 80.0–100.0)
Monocytes Absolute: 0.5 K/uL (ref 0.1–1.0)
Monocytes Relative: 6 %
Neutro Abs: 4.9 K/uL (ref 1.7–7.7)
Neutrophils Relative %: 61 %
Platelet Count: 272 K/uL (ref 150–400)
RBC: 2.98 MIL/uL — ABNORMAL LOW (ref 3.87–5.11)
RDW: 12.5 % (ref 11.5–15.5)
WBC Count: 8 K/uL (ref 4.0–10.5)
nRBC: 0 % (ref 0.0–0.2)

## 2024-11-27 LAB — IRON AND TIBC
Iron: 61 ug/dL (ref 28–170)
Saturation Ratios: 27 % (ref 10.4–31.8)
TIBC: 227 ug/dL — ABNORMAL LOW (ref 250–450)
UIBC: 166 ug/dL

## 2024-11-27 LAB — FERRITIN: Ferritin: 340 ng/mL — ABNORMAL HIGH (ref 11–307)

## 2024-11-27 NOTE — Progress Notes (Signed)
 Hematology/Oncology Consult note 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  She has now been referred to reestablish care.   Results of bloodwork from 03/09/2017 were as follows: CBC showed white count of 9.6, H&H of 10.3/29.9 and a platelet count of 301. CMP was significant for BUN of 39 and creatinine of 1.56. Reticulocyte count was 1.4% and low for the degree of anemia. Ferritin was normal at 51 and iron studies are within normal limits. B12 and folate were within normal limits. TSH was normal at 1.49. Haptoglobin was normal at 187. ESR was elevated at 70. Multiple myeloma panel showed elevated IgG of 1852. Serum monoclonal protein of 0.7 g was noted. Lambda light chain specificity. LDH was normal at 135 and beta-2  microglobulin was elevated at 4.2. 24-hour urine protein revealed 1gm proteinuria and 39 mg monoclonal protein in 24 hours.    Bone marrow biopsy on 04/20/17 showed normocellular marrow with trilineage hematopoiesis and mild increase in plasma cells 9% (5-10%)   Most recent labs from 12/16/2020 showed white count of 7.5, H&H of 10.6/31.9 with a platelet count of 288.  Serum creatinine was elevated at 1.5.  Serum calcium  was normal at 9.4 and total protein normal at 7.1.  M spike was 0.9 on serum protein electrophoresis.  Both kappa and lambda free light chains were elevated with a normal free light chain ratio  likely secondary to CKD  Interval history-she has baseline fatigue but overall she has been stable healthwise.  No recent hospitalizations  ECOG PS- 2 Pain scale- 0   Review of systems- Review of Systems  Constitutional:  Negative for chills, fever, malaise/fatigue and weight loss.  HENT:  Negative for congestion, ear discharge and nosebleeds.   Eyes:  Negative for blurred vision.  Respiratory:  Negative for cough, hemoptysis, sputum production, shortness of breath and wheezing.   Cardiovascular:  Negative for chest pain, palpitations, orthopnea and claudication.  Gastrointestinal:  Negative for abdominal pain, blood in stool, constipation, diarrhea, heartburn, melena, nausea and vomiting.  Genitourinary:  Negative for dysuria, flank pain, frequency, hematuria and urgency.  Musculoskeletal:  Negative for back pain, joint pain and myalgias.  Skin:  Negative for rash.  Neurological:  Negative for dizziness, tingling, focal weakness, seizures, weakness and headaches.  Endo/Heme/Allergies:  Does not bruise/bleed easily.  Psychiatric/Behavioral:  Negative for depression and suicidal ideas. The patient does not have insomnia.       Allergies  Allergen Reactions   Atorvastatin  Other (See Comments)    Muscle pain, patient prefers to continue taking statin. Dose halved.   Ziprasidone Other (See Comments)   Ziprasidone Hcl Other (See Comments)     Past Medical History:  Diagnosis Date   Anemia    Cataract    right eye getting it fixed 06/11/2021   Chest pain    a. 12/2022 MV: EF 72%. no ischemia/infarct. 3 vessel cor Ca2+. Mild Ao atherosclerosis.   CKD (chronic  kidney disease) stage 4, GFR 15-29 ml/min (HCC)    Coronary artery calcification    a. 12/2022 MV: CT attenuation correction images with three-vessel coronary artery calcification mild aortic atherosclerosis.   Depression    Diabetes mellitus without complication (HCC)    Diastolic dysfunction    a. 12/2022 Echo: EF 60-65%. No  rwma, GrI DD, nl RV fxn, mild MR.   GERD (gastroesophageal reflux disease)    Gout    Hypercholesteremia    Hypertension    Monoclonal gammopathy    Pulmonary nodules    a. 09/2023 CT chest: No fibrotic ILD.  Multiple pulmonary nodules up to 5 mm in the left upper lobe-nonspecific.   Schizophrenia (HCC)    pt denies this dx   Stroke [facility])      Past Surgical History:  Procedure Laterality Date   CATARACT EXTRACTION W/PHACO Left 03/11/2016   Procedure: CATARACT EXTRACTION PHACO AND INTRAOCULAR LENS PLACEMENT (IOC);  Surgeon: Dene Etienne, MD;  Location: [facility];  Service: Ophthalmology;  Laterality: Left;  DIABETIC - insulin   COLONOSCOPY WITH PROPOFOL  N/A 01/26/2022   Procedure: COLONOSCOPY WITH PROPOFOL ;  Surgeon: Jinny Carmine, MD;  Location: [facility] ENDOSCOPY;  Service: Endoscopy;  Laterality: N/A;   ESOPHAGOGASTRODUODENOSCOPY N/A 01/26/2022   Procedure: ESOPHAGOGASTRODUODENOSCOPY (EGD);  Surgeon: Jinny Carmine, MD;  Location: [facility] ENDOSCOPY;  Service: Endoscopy;  Laterality: N/A;   ORIF TIBIA PLATEAU Right 09/05/06   [facility]   TUBAL LIGATION      Social History   Socioeconomic History   Marital status: Divorced    Spouse name: Not on file   Number of children: Not on file   Years of education: Not on file   Highest education level: Not on file  Occupational History   Not on file  Tobacco Use   Smoking status: Former    Current packs/day: 0.00    Average packs/day: 0.5 packs/day for 5.0 years (2.5 ttl pk-yrs)    Types: Cigarettes    Start date: 2016    Quit date: 2021    Years since quitting: 4.9   Smokeless tobacco: Former    Types: Snuff   Tobacco comments:    Quit 2 years ago. At her heaviest she smoked 2 packs a week.  Vaping Use   Vaping status: Never Used  Substance and Sexual Activity   Alcohol use: Not Currently    Alcohol/week: 1.0 standard drink of alcohol    Types: 1 Cans of beer per week    Comment: rarely   Drug use: No   Sexual activity:  Never  Other Topics Concern   Not on file  Social History Narrative   Not on file   Social Drivers of Health   Financial Resource Strain: Not on file  Food Insecurity: Not on file  Transportation Needs: Not on file  Physical Activity: Not on file  Stress: Not on file  Social Connections: Not on file  Intimate Partner Violence: Not on file    Family History  Problem Relation Age of Onset   Diabetes Sister    Breast cancer Neg Hx      Current Outpatient Medications:    ACCU-CHEK AVIVA PLUS test strip, 1 each daily., Disp: , Rfl:    aspirin  81 MG chewable tablet, Chew 1 tablet (81 mg total) by mouth daily., Disp: , Rfl:    atorvastatin  (LIPITOR) 80 MG tablet, Take 1 tablet (80 mg total) by mouth daily., Disp: 90 tablet, Rfl: 3   JARDIANCE 10 MG  TABS tablet, Take 10 mg by mouth daily., Disp: , Rfl:    LANTUS SOLOSTAR 100 UNIT/ML Solostar Pen, Inject 10 Units into the skin at bedtime., Disp: , Rfl:    Misc. Devices (WALKER) MISC, 1 each by Does not apply route daily as needed., Disp: 1 each, Rfl: 0  Physical exam:  Vitals:   11/27/24 0947  BP: 139/71  Pulse: 78  Resp: 20  Temp: 98.6 F (37 C)  SpO2: 100%  Weight: 177 lb 1.6 oz (80.3 kg)   Physical Exam Constitutional:      Comments: Sitting in a wheelchair.  Appears in no acute distress  Cardiovascular:     Rate and Rhythm: Normal rate and regular rhythm.     Heart sounds: Normal heart sounds.  Pulmonary:     Effort: Pulmonary effort is normal.     Breath sounds: Normal breath sounds.  Skin:    General: Skin is warm and dry.  Neurological:     Mental Status: She is alert and oriented to person, place, and time.      I have personally reviewed labs listed below:    Latest Ref Rng & Units 03/21/2024    9:17 AM  CMP  Glucose 70 - 99 mg/dL 852   BUN 8 - 23 mg/dL 81   Creatinine 9.55 - 1.00 mg/dL 6.85   Sodium 864 - 854 mmol/L 139   Potassium 3.5 - 5.1 mmol/L 4.7   Chloride 98 - 111 mmol/L 110   CO2 22 - 32  mmol/L 22   Calcium  8.9 - 10.3 mg/dL 8.8   Total Protein 6.5 - 8.1 g/dL 7.2   Total Bilirubin 0.0 - 1.2 mg/dL 0.6   Alkaline Phos 38 - 126 U/L 66   AST 15 - 41 U/L 13   ALT 0 - 44 U/L 9       Latest Ref Rng & Units 11/27/2024    9:37 AM  CBC  WBC 4.0 - 10.5 K/uL 8.0   Hemoglobin 12.0 - 15.0 g/dL 9.1   Hematocrit 63.9 - 46.0 % 26.7   Platelets 150 - 400 K/uL 272       Assessment and plan- Patient is a 73 y.o. female here for routine follow-up of anemia of chronic kidney disease  Patient's hemoglobin typically has been between 9.5-10.5.  She has not required any IV iron so far.  Her hemoglobin today is somewhat lower at 9.1.  Iron studies are currently pending.  Holding off on starting any EPO at this time.  If hemoglobin is consistently 9 or lower we will plan to initiate EPO at that time.  Labs in 3 and 6 months and I will see her back in 6 months.  Goal is to keep ferritin more than 100 and iron saturation more than 20%   Visit Diagnosis 1. Anemia of chronic kidney failure, stage 4 (severe) (HCC)      Dr. Annah Skene, MD, MPH [facility] [phone] 11/27/2024 10:59 AM

## 2024-12-08 ENCOUNTER — Other Ambulatory Visit: Payer: Self-pay | Admitting: Internal Medicine

## 2024-12-08 DIAGNOSIS — Z1231 Encounter for screening mammogram for malignant neoplasm of breast: Secondary | ICD-10-CM

## 2025-01-11 ENCOUNTER — Emergency Department
Admission: EM | Admit: 2025-01-11 | Discharge: 2025-01-11 | Disposition: A | Discharge status: Home / Self Care | Attending: Emergency Medicine | Admitting: Emergency Medicine

## 2025-01-11 ENCOUNTER — Other Ambulatory Visit: Payer: Self-pay

## 2025-01-11 ENCOUNTER — Emergency Department

## 2025-01-11 DIAGNOSIS — M109 Gout, unspecified: Secondary | ICD-10-CM | POA: Insufficient documentation

## 2025-01-11 DIAGNOSIS — N189 Chronic kidney disease, unspecified: Secondary | ICD-10-CM | POA: Diagnosis not present

## 2025-01-11 DIAGNOSIS — M79674 Pain in right toe(s): Secondary | ICD-10-CM | POA: Diagnosis present

## 2025-01-11 DIAGNOSIS — I129 Hypertensive chronic kidney disease with stage 1 through stage 4 chronic kidney disease, or unspecified chronic kidney disease: Secondary | ICD-10-CM | POA: Insufficient documentation

## 2025-01-11 DIAGNOSIS — E1122 Type 2 diabetes mellitus with diabetic chronic kidney disease: Secondary | ICD-10-CM | POA: Diagnosis not present

## 2025-01-11 LAB — BASIC METABOLIC PANEL WITH GFR
Anion gap: 10 (ref 5–15)
BUN: 46 mg/dL — ABNORMAL HIGH (ref 8–23)
CO2: 21 mmol/L — ABNORMAL LOW (ref 22–32)
Calcium: 9.4 mg/dL (ref 8.9–10.3)
Chloride: 105 mmol/L (ref 98–111)
Creatinine, Ser: 2.87 mg/dL — ABNORMAL HIGH (ref 0.44–1.00)
GFR, Estimated: 17 mL/min — ABNORMAL LOW
Glucose, Bld: 150 mg/dL — ABNORMAL HIGH (ref 70–99)
Potassium: 4.5 mmol/L (ref 3.5–5.1)
Sodium: 136 mmol/L (ref 135–145)

## 2025-01-11 LAB — CBC
HCT: 26.9 % — ABNORMAL LOW (ref 36.0–46.0)
Hemoglobin: 8.9 g/dL — ABNORMAL LOW (ref 12.0–15.0)
MCH: 30.3 pg (ref 26.0–34.0)
MCHC: 33.1 g/dL (ref 30.0–36.0)
MCV: 91.5 fL (ref 80.0–100.0)
Platelets: 272 K/uL (ref 150–400)
RBC: 2.94 MIL/uL — ABNORMAL LOW (ref 3.87–5.11)
RDW: 12.5 % (ref 11.5–15.5)
WBC: 9.8 K/uL (ref 4.0–10.5)
nRBC: 0 % (ref 0.0–0.2)

## 2025-01-11 LAB — URIC ACID: Uric Acid, Serum: 8.7 mg/dL — ABNORMAL HIGH (ref 2.5–7.1)

## 2025-01-11 MED ORDER — PREDNISONE 20 MG PO TABS: 60.0000 mg | ORAL_TABLET | Freq: Every day | ORAL | 0 refills | Status: AC

## 2025-01-11 MED ORDER — PREDNISONE 20 MG PO TABS
60.0000 mg | ORAL_TABLET | Freq: Once | ORAL | Status: AC
  Administered 2025-01-11: 60 mg via ORAL
  Filled 2025-01-11: qty 3

## 2025-01-11 MED ORDER — OXYCODONE-ACETAMINOPHEN 5-325 MG PO TABS: 1.0000 | ORAL_TABLET | ORAL | 0 refills | Status: AC | PRN

## 2025-01-11 MED ORDER — OXYCODONE-ACETAMINOPHEN 5-325 MG PO TABS
1.0000 | ORAL_TABLET | Freq: Once | ORAL | Status: AC
  Administered 2025-01-11: 1 via ORAL
  Filled 2025-01-11: qty 1

## 2025-01-11 NOTE — ED Provider Notes (Signed)
----------------------------------------- °  3:08 PM on 01/11/2025 -----------------------------------------  Blood pressure (!) 190/81, pulse 79, temperature 97.8 F (36.6 C), resp. rate 18, height 5' 3 (1.6 m), weight 81.6 kg, SpO2 97%.  Assuming care from Dr. Waymond.  In short, Jennifer Wood is a 74 y.o. female with a chief complaint of great toe pain.  Refer to the original H&P for additional details.  The current plan of care is to follow-up MR of foot to assess for osteo vs gout.  ----------------------------------------- 7:46 PM on 01/11/2025 ----------------------------------------- MRI of the right foot shows soft tissue swelling as well as degenerative changes consistent with gout.  No evidence of osteomyelitis or septic arthritis.  Soft tissue swelling read by radiology as possibly concerning for cellulitis, but on exam there is no erythema or warmth of the soft tissues and I have low suspicion for infectious process.  Workup most consistent with gout flare as patient reports having the exact same symptoms affecting her left toe previously, diagnosed with gout at that time.  She is appropriate for outpatient management with PCP follow-up, will prescribe pain medication and steroids.  She was counseled to return to the ED for new or worsening symptoms, patient agrees with plan.  Medications  oxyCODONE -acetaminophen  (PERCOCET/ROXICET) 5-325 MG per tablet 1 tablet (has no administration in time range)  predniSONE  (DELTASONE ) tablet 60 mg (has no administration in time range)  oxyCODONE -acetaminophen  (PERCOCET/ROXICET) 5-325 MG per tablet 1 tablet (1 tablet Oral Given 01/11/25 1443)     ED Discharge Orders          Ordered    oxyCODONE -acetaminophen  (PERCOCET) 5-325 MG tablet  Every 4 hours PRN        01/11/25 1945    predniSONE  (DELTASONE ) 20 MG tablet  Daily with breakfast        01/11/25 1945           Final diagnoses:  Pain of toe of right foot  Acute gout involving toe of  right foot, unspecified cause      Willo Dunnings, MD 01/11/25 1947

## 2025-01-11 NOTE — ED Provider Notes (Signed)
 [facility] Provider Note    Event Date/Time   First MD Initiated Contact with Patient 01/11/25 1405     (approximate)   History   Gout   HPI  Jennifer Wood is a 74 y.o. female with history of schizophrenia, diabetes, hypertension, gout, presenting with 2 days of large toe pain.  States that it is located on her right side.  She denies any trauma or falls.  No open wounds.  States that symptoms feel very similar to prior gout.  States that she is not on any current medications for gout.  Was treated in the past with prednisone .  No fever.   On independent review, was seen by oncology in December, has history of anemia CKD.  Their plan is if hemoglobin is consistently lower than 9, they will initiate EPO.      Physical Exam   Triage Vital Signs: ED Triage Vitals  Encounter Vitals Group     BP 01/11/25 1221 (!) 190/81     Girls Systolic BP Percentile --      Girls Diastolic BP Percentile --      Boys Systolic BP Percentile --      Boys Diastolic BP Percentile --      Pulse Rate 01/11/25 1218 79     Resp 01/11/25 1218 18     Temp 01/11/25 1221 97.8 F (36.6 C)     Temp src --      SpO2 01/11/25 1218 97 %     Weight 01/11/25 1220 180 lb (81.6 kg)     Height 01/11/25 1220 5' 3 (1.6 m)     Head Circumference --      Peak Flow --      Pain Score 01/11/25 1218 10     Pain Loc --      Pain Education --      Exclude from Growth Chart --     Most recent vital signs: Vitals:   01/11/25 1221 01/11/25 1422  BP: (!) 190/81   Pulse:    Resp:    Temp: 97.8 F (36.6 C)   SpO2:  97%     General: Awake, no distress.  CV:  Good peripheral perfusion.  Resp:  Normal effort.  Abd:  No distention.  Other:  Palpable DP pulses on the right, no erythema or open wounds.  She is tender to the right first MTP, some mild swelling to the area.  Able to passively range her toe, sensation is intact.   ED Results / Procedures / Treatments   Labs (all labs  ordered are listed, but only abnormal results are displayed) Labs Reviewed  CBC - Abnormal; Notable for the following components:      Result Value   RBC 2.94 (*)    Hemoglobin 8.9 (*)    HCT 26.9 (*)    All other components within normal limits  BASIC METABOLIC PANEL WITH GFR - Abnormal; Notable for the following components:   CO2 21 (*)    Glucose, Bld 150 (*)    BUN 46 (*)    Creatinine, Ser 2.87 (*)    GFR, Estimated 17 (*)    All other components within normal limits  URIC ACID      RADIOLOGY On my independent interpretation, x-ray without obvious fracture   PROCEDURES:  Critical Care performed: No  Procedures   MEDICATIONS ORDERED IN ED: Medications  oxyCODONE -acetaminophen  (PERCOCET/ROXICET) 5-325 MG per tablet 1 tablet (1 tablet Oral  Given 01/11/25 1443)     IMPRESSION / MDM / ASSESSMENT AND PLAN / ED COURSE  I reviewed the triage vital signs and the nursing notes.                              Differential diagnosis includes, but is not limited to, gout flare, musculoskeletal pain, strain.  Did consider fracture but patient denies any trauma.  Doubt infection at this time.  X-ray.  Labs were obtained out of triage.  Will give her a dose of that here.  Patient's presentation is most consistent with acute presentation with potential threat to life or bodily function.  Independent interpretation of labs and imaging below.  Given her history of gout, successfully treated with prednisone  in the past, history of CKD, she is not a good candidate for NSAIDs or colchicine.  Will plan to give her a burst dose of steroids and have her closely follow-up with primary care make sure her glucose is controlled.  X-ray shows tiny erosion into the base of the first metatarsal bone that is new.  Could be inflammatory versus infection.  Will put in an MRI for her to make sure that this is not early osteomyelitis.  Especially since for gout she will need to be placed on steroids.   Patient signed out pending MRI.    Clinical Course as of 01/11/25 1520  Thu Jan 11, 2025  1415 Independent review of labs, electrolytes not severely deranged, creatinine is elevated but downtrending compared to prior, no leukocytosis.  H&H is stable compared to 1 month ago. [TT]  1519 DG Foot Complete Right IMPRESSION: 1. Tiny erosion within the medial base of the first metatarsal bone, new from the previous exam, most likely reflecting an inflammatory arthropathy such as gout, with infection or other erosive arthropathy as additional considerations; if symptoms persist or there is concern for infection or inflammatory arthropathy, consider MRI for further evaluation and/or short-interval follow-up radiographs. 2. Dorsal soft tissue swelling of the forefoot. 3. 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   [TT]    Clinical Course User Index [TT] Waymond, Lorelle Cummins, MD     FINAL CLINICAL IMPRESSION(S) / ED DIAGNOSES   Final diagnoses:  Pain of toe of right foot     Rx / DC Orders   ED Discharge Orders     None        Note:  This document was prepared using Dragon voice recognition software and may include unintentional dictation errors.    Waymond Lorelle Cummins, MD 01/11/25 [phone]

## 2025-01-11 NOTE — ED Triage Notes (Signed)
 Pt to ED for gout to right big toe x2 days.

## 2025-01-15 ENCOUNTER — Encounter

## 2025-02-26 ENCOUNTER — Inpatient Hospital Stay

## 2025-05-28 ENCOUNTER — Inpatient Hospital Stay

## 2025-05-28 ENCOUNTER — Inpatient Hospital Stay: Admitting: Oncology
